# Patient Record
Sex: Female | Born: 1945 | Race: White | Hispanic: No | Marital: Married | Smoking: Never smoker
Health system: Southern US, Community
[De-identification: ages and names within clinical notes are randomized; demographics above are authoritative.]

## PROBLEM LIST (undated history)

## (undated) DIAGNOSIS — E039 Hypothyroidism, unspecified: Secondary | ICD-10-CM

## (undated) DIAGNOSIS — K5792 Diverticulitis of intestine, part unspecified, without perforation or abscess without bleeding: Secondary | ICD-10-CM

## (undated) HISTORY — DX: Diverticulitis of intestine, part unspecified, without perforation or abscess without bleeding: K57.92

## (undated) HISTORY — PX: APPENDECTOMY: SHX54

## (undated) HISTORY — PX: DILATION AND CURETTAGE OF UTERUS: SHX78

## (undated) HISTORY — PX: COLONOSCOPY: SHX174

---

## 1998-08-11 ENCOUNTER — Other Ambulatory Visit: Admission: RE | Admit: 1998-08-11 | Discharge: 1998-08-11 | Payer: Self-pay | Admitting: Gynecology

## 1999-11-14 ENCOUNTER — Other Ambulatory Visit: Admission: RE | Admit: 1999-11-14 | Discharge: 1999-11-14 | Payer: Self-pay | Admitting: Gynecology

## 2001-02-14 ENCOUNTER — Other Ambulatory Visit: Admission: RE | Admit: 2001-02-14 | Discharge: 2001-02-14 | Payer: Self-pay | Admitting: Gynecology

## 2003-04-06 ENCOUNTER — Other Ambulatory Visit: Admission: RE | Admit: 2003-04-06 | Discharge: 2003-04-06 | Payer: Self-pay | Admitting: Gynecology

## 2005-02-09 ENCOUNTER — Other Ambulatory Visit: Admission: RE | Admit: 2005-02-09 | Discharge: 2005-02-09 | Payer: Self-pay | Admitting: Gynecology

## 2005-03-01 ENCOUNTER — Ambulatory Visit: Payer: Self-pay | Admitting: Internal Medicine

## 2005-03-01 ENCOUNTER — Ambulatory Visit (HOSPITAL_COMMUNITY): Admission: RE | Admit: 2005-03-01 | Discharge: 2005-03-01 | Payer: Self-pay | Admitting: Internal Medicine

## 2013-11-25 ENCOUNTER — Encounter (INDEPENDENT_AMBULATORY_CARE_PROVIDER_SITE_OTHER): Payer: Self-pay | Admitting: *Deleted

## 2013-12-15 ENCOUNTER — Ambulatory Visit (INDEPENDENT_AMBULATORY_CARE_PROVIDER_SITE_OTHER): Payer: Medicare Other | Admitting: Internal Medicine

## 2013-12-15 ENCOUNTER — Encounter (INDEPENDENT_AMBULATORY_CARE_PROVIDER_SITE_OTHER): Payer: Self-pay | Admitting: Internal Medicine

## 2013-12-15 VITALS — BP 108/58 | HR 60 | Temp 97.8°F | Ht 67.0 in | Wt 160.6 lb

## 2013-12-15 DIAGNOSIS — K5792 Diverticulitis of intestine, part unspecified, without perforation or abscess without bleeding: Secondary | ICD-10-CM | POA: Insufficient documentation

## 2013-12-15 DIAGNOSIS — K5732 Diverticulitis of large intestine without perforation or abscess without bleeding: Secondary | ICD-10-CM

## 2013-12-15 DIAGNOSIS — E039 Hypothyroidism, unspecified: Secondary | ICD-10-CM | POA: Insufficient documentation

## 2013-12-15 NOTE — Progress Notes (Addendum)
Subjective:     Patient ID: Desiree Flores, female   DOB: Nov 25, 1945, 68 y.o.   MRN: [id number]  HPI Referred to our office by Dr. Gar Ponto for change in stool patterns.  Recently at [facility] with diagnosis of diverticulitis 11/10/2013. In hospital x 3 days.  After discharge she was on 2 antibiotics (DId not bring bottles). Appetite is good. No weight loss.  No abdominal pain. Usually has a BM at least twice a day. No melena or BRRB . She tells me before she had the diverticulitis, her BMs were not right. Her stools were narrow and pencil like. Symptoms for a couple of years. Since having the diverticulitis, her stools are back to normal.  Her stools were guaiac negative in Dr Arcola Jansky office.    Thinks paternal grandfather died of colon cancer ? Age.    Colonoscopy./EGD 2006 by Dr. Laural Golden: negative per Dr. Arcola Jansky notes.   7/6/015 ( Copy from [facility]) CT abdomen/pelvis while in hospital: Fatty infiltration of the liver, as well as inflammatory process in the sigmoid colon consistent with acute diverticulitis. Recommend follow up.  Review of Systems Past Medical History  Diagnosis Date  . Diverticulitis     Past Surgical History  Procedure Laterality Date  . Dilation and curettage of uterus      miscarriage  . Appendectomy      6-7 yrs ago    Allergies  Allergen Reactions  . Sulfa Antibiotics     No current outpatient prescriptions on file prior to visit.   No current facility-administered medications on file prior to visit.        Objective:   Physical Exam Filed Vitals:   12/15/13 1131  BP: 108/58  Pulse: 60  Temp: 97.8 F (36.6 C)  Height: 5\' 7"  (1.702 m)  Weight: 160 lb 9.6 oz (72.848 kg)   Alert and oriented. Skin warm and dry. Oral mucosa is moist.   . Sclera anicteric, conjunctivae is pink. Thyroid not enlarged. No cervical lymphadenopathy. Lungs clear. Heart regular rate and rhythm.  Abdomen is soft. Bowel sounds are positive. No hepatomegaly. No  abdominal masses felt. No tenderness.  No edema to lower extremities.       Assessment:    Recent bout of diverticulitis. Colonic neoplasm needs to be ruled out. Last colonoscopy in 2006;     Plan:    Colonoscopy with Dr. Laural Golden.The risks and benefits such as perforation, bleeding, and infection were reviewed with the patient and is agreeable.   Will get CT and colonoscopy report from [facility].

## 2013-12-15 NOTE — Patient Instructions (Signed)
Colonoscopy with Dr. Rehman. The risks and benefits such as perforation, bleeding, and infection were reviewed with the patient and is agreeable. 

## 2013-12-16 ENCOUNTER — Other Ambulatory Visit (INDEPENDENT_AMBULATORY_CARE_PROVIDER_SITE_OTHER): Payer: Self-pay | Admitting: *Deleted

## 2013-12-16 ENCOUNTER — Telehealth (INDEPENDENT_AMBULATORY_CARE_PROVIDER_SITE_OTHER): Payer: Self-pay | Admitting: *Deleted

## 2013-12-16 DIAGNOSIS — Z1211 Encounter for screening for malignant neoplasm of colon: Secondary | ICD-10-CM

## 2013-12-16 DIAGNOSIS — K5712 Diverticulitis of small intestine without perforation or abscess without bleeding: Secondary | ICD-10-CM

## 2013-12-16 NOTE — Telephone Encounter (Signed)
Patient needs movi prep 

## 2013-12-17 MED ORDER — PEG-KCL-NACL-NASULF-NA ASC-C 100 G PO SOLR: 1.0000 | Freq: Once | ORAL | Status: DC

## 2014-01-02 ENCOUNTER — Encounter (INDEPENDENT_AMBULATORY_CARE_PROVIDER_SITE_OTHER): Payer: Self-pay | Admitting: *Deleted

## 2014-01-27 ENCOUNTER — Encounter (HOSPITAL_COMMUNITY): Payer: Self-pay | Admitting: Pharmacy Technician

## 2014-01-28 ENCOUNTER — Ambulatory Visit (HOSPITAL_COMMUNITY)
Admission: RE | Admit: 2014-01-28 | Discharge: 2014-01-28 | Disposition: A | Discharge status: Home / Self Care | Payer: Medicare Other | Source: Ambulatory Visit | Attending: Internal Medicine | Admitting: Internal Medicine

## 2014-01-28 ENCOUNTER — Encounter (HOSPITAL_COMMUNITY): Payer: Self-pay | Admitting: *Deleted

## 2014-01-28 ENCOUNTER — Encounter (HOSPITAL_COMMUNITY)
Admission: RE | Disposition: A | Discharge status: Home / Self Care | Payer: Self-pay | Source: Ambulatory Visit | Attending: Internal Medicine

## 2014-01-28 DIAGNOSIS — Z09 Encounter for follow-up examination after completed treatment for conditions other than malignant neoplasm: Secondary | ICD-10-CM | POA: Diagnosis not present

## 2014-01-28 DIAGNOSIS — E039 Hypothyroidism, unspecified: Secondary | ICD-10-CM | POA: Diagnosis not present

## 2014-01-28 DIAGNOSIS — K5730 Diverticulosis of large intestine without perforation or abscess without bleeding: Secondary | ICD-10-CM | POA: Insufficient documentation

## 2014-01-28 DIAGNOSIS — K5712 Diverticulitis of small intestine without perforation or abscess without bleeding: Secondary | ICD-10-CM

## 2014-01-28 DIAGNOSIS — Z79899 Other long term (current) drug therapy: Secondary | ICD-10-CM | POA: Diagnosis not present

## 2014-01-28 DIAGNOSIS — K5732 Diverticulitis of large intestine without perforation or abscess without bleeding: Secondary | ICD-10-CM | POA: Diagnosis present

## 2014-01-28 HISTORY — DX: Hypothyroidism, unspecified: E03.9

## 2014-01-28 HISTORY — PX: COLONOSCOPY: SHX5424

## 2014-01-28 SURGERY — COLONOSCOPY
Anesthesia: Moderate Sedation

## 2014-01-28 MED ORDER — MEPERIDINE HCL 50 MG/ML IJ SOLN
INTRAMUSCULAR | Status: DC | PRN
  Administered 2014-01-28 (×2): 25 mg via INTRAVENOUS

## 2014-01-28 MED ORDER — STERILE WATER FOR IRRIGATION IR SOLN
Status: DC | PRN
  Administered 2014-01-28: 14:00:00

## 2014-01-28 MED ORDER — MIDAZOLAM HCL 5 MG/5ML IJ SOLN
INTRAMUSCULAR | Status: AC
  Filled 2014-01-28: qty 10

## 2014-01-28 MED ORDER — MEPERIDINE HCL 50 MG/ML IJ SOLN
INTRAMUSCULAR | Status: AC
  Filled 2014-01-28: qty 1

## 2014-01-28 MED ORDER — SODIUM CHLORIDE 0.9 % IV SOLN
INTRAVENOUS | Status: DC
  Administered 2014-01-28: 13:00:00 via INTRAVENOUS

## 2014-01-28 MED ORDER — MIDAZOLAM HCL 5 MG/5ML IJ SOLN
INTRAMUSCULAR | Status: DC | PRN
  Administered 2014-01-28 (×4): 2 mg via INTRAVENOUS

## 2014-01-28 NOTE — H&P (Signed)
Desiree Flores is an 68 y.o. female.   Chief Complaint: Patient is here for colonoscopy. HPI: This is a 68 year old Caucasian female whose last colonoscopy was in 9 years ago was treated for diverticulitis in July of this year. He was hospitalized by Dr. Gar Ponto at [facility] for three days. She is presently asymptomatic. She denies rectal bleeding. She did notice decrease in caliber of her stools when she was sick. She believes her grandfather had colon cancer but she is not absolutely certain.  Past Medical History  Diagnosis Date  . Diverticulitis   . Hypothyroidism     Past Surgical History  Procedure Laterality Date  . Dilation and curettage of uterus      miscarriage  . Appendectomy      6-7 yrs ago  . Colonoscopy      Family History  Problem Relation Age of Onset  . Colon cancer Other    Social History:  reports that she has never smoked. She does not have any smokeless tobacco history on file. She reports that she does not drink alcohol or use illicit drugs.  Allergies:  Allergies  Allergen Reactions  . Sulfa Antibiotics     Medications Prior to Admission  Medication Sig Dispense Refill  . glucosamine-chondroitin 500-400 MG tablet Take 2 tablets by mouth daily.       Marland Kitchen ibuprofen (ADVIL,MOTRIN) 200 MG tablet Take 200 mg by mouth every 6 (six) hours as needed.      Marland Kitchen levothyroxine (SYNTHROID, LEVOTHROID) 100 MCG tablet Take 100 mcg by mouth daily before breakfast.      . Multiple Vitamins-Minerals ([employer] SPECTRAVITE ADVANCED PO) Take by mouth.      Marland Kitchen OVER THE COUNTER MEDICATION Take 1 tablet by mouth daily.      . peg 3350 powder (MOVIPREP) 100 G SOLR Take 1 kit (200 g total) by mouth once.  1 kit  0    No results found for this or any previous visit (from the past 48 hour(s)). No results found.  ROS  Blood pressure 147/83, pulse 86, temperature 98.1 F (36.7 C), temperature source Oral, resp. rate 18, height 5' 7"  (1.702 m), weight 144 lb (65.318 kg), SpO2  98.00%. Physical Exam  Constitutional: She appears well-developed and well-nourished.  HENT:  Mouth/Throat: Oropharynx is clear and moist.  Eyes: Conjunctivae are normal. No scleral icterus.  Neck: No thyromegaly present.  Cardiovascular: Normal rate, regular rhythm and normal heart sounds.   No murmur heard. Respiratory: Effort normal and breath sounds normal.  GI: Soft. She exhibits no distension and no mass. There is no tenderness.  Musculoskeletal: She exhibits no edema.  Lymphadenopathy:    She has no cervical adenopathy.  Neurological: She is alert.  Skin: Skin is warm and dry.     Assessment/Plan History of sigmoid diverticulitis. Diagnostic colonoscopy.  Desiree Flores U 01/28/2014, 1:52 PM

## 2014-01-28 NOTE — Discharge Instructions (Addendum)
Resume usual medications and high fiber diet. No driving for 24 hours.  Colonoscopy, Care After Refer to this sheet in the next few weeks. These instructions provide you with information on caring for yourself after your procedure. Your health care provider may also give you more specific instructions. Your treatment has been planned according to current medical practices, but problems sometimes occur. Call your health care provider if you have any problems or questions after your procedure. WHAT TO EXPECT AFTER THE PROCEDURE  After your procedure, it is typical to have the following:  A small amount of blood in your stool.  Moderate amounts of gas and mild abdominal cramping or bloating. HOME CARE INSTRUCTIONS  Do not drive, operate machinery, or sign important documents for 24 hours.  You may shower and resume your regular physical activities, but move at a slower pace for the first 24 hours.  Take frequent rest periods for the first 24 hours.  Walk around or put a warm pack on your abdomen to help reduce abdominal cramping and bloating.  Drink enough fluids to keep your urine clear or pale yellow.  You may resume your normal diet as instructed by your health care provider. Avoid heavy or fried foods that are hard to digest.  Avoid drinking alcohol for 24 hours or as instructed by your health care provider.  Only take over-the-counter or prescription medicines as directed by your health care provider.Marland Kitchen SEEK MEDICAL CARE IF: You have persistent spotting of blood in your stool 2-3 days after the procedure. SEEK IMMEDIATE MEDICAL CARE IF:  You have more than a small spotting of blood in your stool.  You pass large blood clots in your stool.  Your abdomen is swollen (distended).  You have nausea or vomiting.  You have a fever.  You have increasing abdominal pain that is not relieved with medicine.   Diverticulosis Diverticulosis is the condition that develops when small  pouches (diverticula) form in the wall of your colon. Your colon, or large intestine, is where water is absorbed and stool is formed. The pouches form when the inside layer of your colon pushes through weak spots in the outer layers of your colon. CAUSES  No one knows exactly what causes diverticulosis. RISK FACTORS  Being older than 5. Your risk for this condition increases with age. Diverticulosis is rare in people younger than 40 years. By age 51, almost everyone has it.  Eating a low-fiber diet.  Being frequently constipated.  Being overweight.  Not getting enough exercise.  Smoking.  Taking over-the-counter pain medicines, like aspirin and ibuprofen. SYMPTOMS  Most people with diverticulosis do not have symptoms. DIAGNOSIS  Because diverticulosis often has no symptoms, health care providers often discover the condition during an exam for other colon problems. In many cases, a health care provider will diagnose diverticulosis while using a flexible scope to examine the colon (colonoscopy). TREATMENT  If you have never developed an infection related to diverticulosis, you may not need treatment. If you have had an infection before, treatment may include:  Eating more fruits, vegetables, and grains.  Taking a fiber supplement.  Taking a live bacteria supplement (probiotic).  Taking medicine to relax your colon. HOME CARE INSTRUCTIONS   Drink at least 6-8 glasses of water each day to prevent constipation.  Try not to strain when you have a bowel movement.  Keep all follow-up appointments. If you have had an infection before:  Increase the fiber in your diet as directed by your health  care provider or dietitian.  Take a dietary fiber supplement if your health care provider approves.  Only take medicines as directed by your health care provider. SEEK MEDICAL CARE IF:   You have abdominal pain.  You have bloating.  You have cramps.  You have not gone to the  bathroom in 3 days. SEEK IMMEDIATE MEDICAL CARE IF:   Your pain gets worse.  Yourbloating becomes very bad.  You have a fever or chills, and your symptoms suddenly get worse.  You begin vomiting.  You have bowel movements that are bloody or black. MAKE SURE YOU:  Understand these instructions.  Will watch your condition.  Will get help right away if you are not doing well or get worse. Document Released: 01/20/2004 Document Revised: 04/29/2013 Document Reviewed: 03/19/2013 [facility] Patient Information 2015 [place], [place]. This information is not intended to replace advice given to you by your health care provider. Make sure you discuss any questions you have with your health care provider.

## 2014-01-28 NOTE — Op Note (Signed)
COLONOSCOPY PROCEDURE REPORT  PATIENT:  Desiree Flores  MR#:  [id number] Birthdate:  21-Nov-1945, 68 y.o., female Endoscopist:  Dr. Rogene Houston, MD Referred By:  Dr. Gar Ponto, MD  Procedure Date: 01/28/2014  Procedure:   Colonoscopy  Indications: Patient is 68 year old Caucasian female who is here for diagnostic colonoscopy. About two months ago she was treated for sigmoid diverticulitis. She is now symptom free. Last colonoscopy was in 2006. Family history is negative for CRC to  Informed Consent:  The procedure and risks were reviewed with the patient and informed consent was obtained.  Medications:  Demerol 50 mg IV Versed 8 mg IV  Description of procedure:  After a digital rectal exam was performed, that colonoscope was advanced from the anus through the rectum and colon to the area of the cecum, ileocecal valve and appendiceal orifice. The cecum was deeply intubated. These structures were well-seen and photographed for the record. From the level of the cecum and ileocecal valve, the scope was slowly and cautiously withdrawn. The mucosal surfaces were carefully surveyed utilizing scope tip to flexion to facilitate fold flattening as needed. The scope was pulled down into the rectum where a thorough exam including retroflexion was performed. Procedure was started with pediatric colonoscope but was changed to a slim scope in order to pass flow from sigmoid into the descending colon.  Findings:   Prep excellent. Normal mucosa of cecum, ascending colon hepatic flexure transverse colon, splenic flexure and descending colon. Scattered small diverticula noted in mid to proximal sigmoid colon without erythema edema or exudate. Normal rectal mucosa and anorectal junction.   Therapeutic/Diagnostic Maneuvers Performed:   None  Complications:  None  Cecal Withdrawal Time:  11 minutes  Impression:  Examination performed to cecum. Sigmoid colon diverticulosis otherwise normal  colonoscopy.  Recommendations:  Standard instructions given. High fiber diet. Patient advised to keep NSAID use to minimum. Next colonoscopy in 10 years.  Hadasah Brugger U  01/28/2014 2:40 PM  CC: Dr. Gar Ponto, MD & Dr. Rayne Du ref. provider found

## 2014-01-30 ENCOUNTER — Encounter (HOSPITAL_COMMUNITY): Payer: Self-pay | Admitting: Internal Medicine

## 2014-04-24 ENCOUNTER — Encounter (INDEPENDENT_AMBULATORY_CARE_PROVIDER_SITE_OTHER): Payer: Self-pay

## 2014-08-10 DIAGNOSIS — K219 Gastro-esophageal reflux disease without esophagitis: Secondary | ICD-10-CM | POA: Diagnosis not present

## 2014-08-10 DIAGNOSIS — E782 Mixed hyperlipidemia: Secondary | ICD-10-CM | POA: Diagnosis not present

## 2014-08-10 DIAGNOSIS — K760 Fatty (change of) liver, not elsewhere classified: Secondary | ICD-10-CM | POA: Diagnosis not present

## 2014-08-10 DIAGNOSIS — E039 Hypothyroidism, unspecified: Secondary | ICD-10-CM | POA: Diagnosis not present

## 2014-08-13 DIAGNOSIS — E782 Mixed hyperlipidemia: Secondary | ICD-10-CM | POA: Diagnosis not present

## 2014-08-13 DIAGNOSIS — K219 Gastro-esophageal reflux disease without esophagitis: Secondary | ICD-10-CM | POA: Diagnosis not present

## 2014-08-13 DIAGNOSIS — K760 Fatty (change of) liver, not elsewhere classified: Secondary | ICD-10-CM | POA: Diagnosis not present

## 2014-08-13 DIAGNOSIS — K5730 Diverticulosis of large intestine without perforation or abscess without bleeding: Secondary | ICD-10-CM | POA: Diagnosis not present

## 2014-08-13 DIAGNOSIS — E039 Hypothyroidism, unspecified: Secondary | ICD-10-CM | POA: Diagnosis not present

## 2015-02-18 DIAGNOSIS — Z23 Encounter for immunization: Secondary | ICD-10-CM | POA: Diagnosis not present

## 2015-04-26 DIAGNOSIS — M545 Low back pain: Secondary | ICD-10-CM | POA: Diagnosis not present

## 2015-07-20 DIAGNOSIS — E039 Hypothyroidism, unspecified: Secondary | ICD-10-CM | POA: Diagnosis not present

## 2015-07-20 DIAGNOSIS — M1711 Unilateral primary osteoarthritis, right knee: Secondary | ICD-10-CM | POA: Diagnosis not present

## 2015-07-20 DIAGNOSIS — S6981XA Other specified injuries of right wrist, hand and finger(s), initial encounter: Secondary | ICD-10-CM | POA: Diagnosis not present

## 2015-07-20 DIAGNOSIS — E782 Mixed hyperlipidemia: Secondary | ICD-10-CM | POA: Diagnosis not present

## 2015-07-20 DIAGNOSIS — K219 Gastro-esophageal reflux disease without esophagitis: Secondary | ICD-10-CM | POA: Diagnosis not present

## 2015-07-23 DIAGNOSIS — E039 Hypothyroidism, unspecified: Secondary | ICD-10-CM | POA: Diagnosis not present

## 2015-07-23 DIAGNOSIS — K5730 Diverticulosis of large intestine without perforation or abscess without bleeding: Secondary | ICD-10-CM | POA: Diagnosis not present

## 2015-07-23 DIAGNOSIS — Z23 Encounter for immunization: Secondary | ICD-10-CM | POA: Diagnosis not present

## 2015-07-23 DIAGNOSIS — E782 Mixed hyperlipidemia: Secondary | ICD-10-CM | POA: Diagnosis not present

## 2015-07-23 DIAGNOSIS — M20011 Mallet finger of right finger(s): Secondary | ICD-10-CM | POA: Diagnosis not present

## 2015-07-23 DIAGNOSIS — Z0001 Encounter for general adult medical examination with abnormal findings: Secondary | ICD-10-CM | POA: Diagnosis not present

## 2015-07-23 DIAGNOSIS — Z1389 Encounter for screening for other disorder: Secondary | ICD-10-CM | POA: Diagnosis not present

## 2015-07-30 ENCOUNTER — Encounter (HOSPITAL_COMMUNITY): Payer: Self-pay | Admitting: Emergency Medicine

## 2015-07-30 ENCOUNTER — Emergency Department (HOSPITAL_COMMUNITY): Payer: Medicare Other

## 2015-07-30 ENCOUNTER — Emergency Department (HOSPITAL_COMMUNITY)
Admission: EM | Admit: 2015-07-30 | Discharge: 2015-07-30 | Disposition: A | Discharge status: Home / Self Care | Payer: Medicare Other | Source: Home / Self Care | Attending: Emergency Medicine | Admitting: Emergency Medicine

## 2015-07-30 DIAGNOSIS — E039 Hypothyroidism, unspecified: Secondary | ICD-10-CM | POA: Insufficient documentation

## 2015-07-30 DIAGNOSIS — K449 Diaphragmatic hernia without obstruction or gangrene: Secondary | ICD-10-CM | POA: Diagnosis not present

## 2015-07-30 DIAGNOSIS — R109 Unspecified abdominal pain: Secondary | ICD-10-CM | POA: Diagnosis not present

## 2015-07-30 DIAGNOSIS — R112 Nausea with vomiting, unspecified: Secondary | ICD-10-CM | POA: Insufficient documentation

## 2015-07-30 DIAGNOSIS — K219 Gastro-esophageal reflux disease without esophagitis: Secondary | ICD-10-CM | POA: Diagnosis not present

## 2015-07-30 DIAGNOSIS — Z79899 Other long term (current) drug therapy: Secondary | ICD-10-CM

## 2015-07-30 DIAGNOSIS — R1013 Epigastric pain: Secondary | ICD-10-CM | POA: Diagnosis not present

## 2015-07-30 DIAGNOSIS — K859 Acute pancreatitis without necrosis or infection, unspecified: Secondary | ICD-10-CM | POA: Diagnosis not present

## 2015-07-30 DIAGNOSIS — Z823 Family history of stroke: Secondary | ICD-10-CM | POA: Diagnosis not present

## 2015-07-30 DIAGNOSIS — Z800 Family history of malignant neoplasm of digestive organs: Secondary | ICD-10-CM | POA: Diagnosis not present

## 2015-07-30 DIAGNOSIS — R0789 Other chest pain: Secondary | ICD-10-CM

## 2015-07-30 DIAGNOSIS — K5712 Diverticulitis of small intestine without perforation or abscess without bleeding: Secondary | ICD-10-CM | POA: Diagnosis not present

## 2015-07-30 DIAGNOSIS — K222 Esophageal obstruction: Secondary | ICD-10-CM | POA: Diagnosis not present

## 2015-07-30 DIAGNOSIS — K5710 Diverticulosis of small intestine without perforation or abscess without bleeding: Secondary | ICD-10-CM | POA: Diagnosis not present

## 2015-07-30 DIAGNOSIS — Z791 Long term (current) use of non-steroidal anti-inflammatories (NSAID): Secondary | ICD-10-CM | POA: Insufficient documentation

## 2015-07-30 DIAGNOSIS — M1711 Unilateral primary osteoarthritis, right knee: Secondary | ICD-10-CM | POA: Diagnosis not present

## 2015-07-30 DIAGNOSIS — Z8249 Family history of ischemic heart disease and other diseases of the circulatory system: Secondary | ICD-10-CM | POA: Diagnosis not present

## 2015-07-30 DIAGNOSIS — R079 Chest pain, unspecified: Secondary | ICD-10-CM

## 2015-07-30 DIAGNOSIS — K2980 Duodenitis without bleeding: Secondary | ICD-10-CM | POA: Diagnosis not present

## 2015-07-30 LAB — CBC WITH DIFFERENTIAL/PLATELET
BASOS ABS: 0 10*3/uL (ref 0.0–0.1)
BASOS PCT: 0 %
Eosinophils Absolute: 0.2 10*3/uL (ref 0.0–0.7)
Eosinophils Relative: 2 %
HCT: 44.7 % (ref 36.0–46.0)
HEMOGLOBIN: 15 g/dL (ref 12.0–15.0)
Lymphocytes Relative: 16 %
Lymphs Abs: 1.5 10*3/uL (ref 0.7–4.0)
MCH: 29.8 pg (ref 26.0–34.0)
MCHC: 33.6 g/dL (ref 30.0–36.0)
MCV: 88.7 fL (ref 78.0–100.0)
Monocytes Absolute: 0.7 10*3/uL (ref 0.1–1.0)
Monocytes Relative: 8 %
NEUTROS ABS: 7 10*3/uL (ref 1.7–7.7)
NEUTROS PCT: 74 %
PLATELETS: 162 10*3/uL (ref 150–400)
RBC: 5.04 MIL/uL (ref 3.87–5.11)
RDW: 13.3 % (ref 11.5–15.5)
WBC: 9.5 10*3/uL (ref 4.0–10.5)

## 2015-07-30 LAB — BASIC METABOLIC PANEL
ANION GAP: 10 (ref 5–15)
BUN: 22 mg/dL — ABNORMAL HIGH (ref 6–20)
CALCIUM: 10.3 mg/dL (ref 8.9–10.3)
CO2: 27 mmol/L (ref 22–32)
Chloride: 104 mmol/L (ref 101–111)
Creatinine, Ser: 0.91 mg/dL (ref 0.44–1.00)
GFR calc non Af Amer: >60 mL/min (ref >60)
GLUCOSE: 118 mg/dL — AB (ref 65–99)
Potassium: 3.7 mmol/L (ref 3.5–5.1)
Sodium: 141 mmol/L (ref 135–145)

## 2015-07-30 LAB — HEPATIC FUNCTION PANEL
ALT: 53 U/L (ref 14–54)
AST: 32 U/L (ref 15–41)
Albumin: 4.3 g/dL (ref 3.5–5.0)
Alkaline Phosphatase: 68 U/L (ref 38–126)
BILIRUBIN INDIRECT: 0.6 mg/dL (ref 0.3–0.9)
BILIRUBIN TOTAL: 0.7 mg/dL (ref 0.3–1.2)
Bilirubin, Direct: 0.1 mg/dL (ref 0.1–0.5)
TOTAL PROTEIN: 7.2 g/dL (ref 6.5–8.1)

## 2015-07-30 LAB — LIPASE, BLOOD: Lipase: 49 U/L (ref 11–51)

## 2015-07-30 LAB — TROPONIN I

## 2015-07-30 MED ORDER — ONDANSETRON 8 MG PO TBDP: 8.0000 mg | ORAL_TABLET | Freq: Three times a day (TID) | ORAL | Status: DC | PRN

## 2015-07-30 MED ORDER — GI COCKTAIL ~~LOC~~
30.0000 mL | Freq: Once | ORAL | Status: AC
  Administered 2015-07-30: 30 mL via ORAL
  Filled 2015-07-30: qty 30

## 2015-07-30 MED ORDER — ONDANSETRON 8 MG PO TBDP
8.0000 mg | ORAL_TABLET | Freq: Once | ORAL | Status: AC
  Administered 2015-07-30: 8 mg via ORAL
  Filled 2015-07-30: qty 1

## 2015-07-30 MED ORDER — MORPHINE SULFATE (PF) 4 MG/ML IV SOLN
4.0000 mg | Freq: Once | INTRAVENOUS | Status: AC
  Administered 2015-07-30: 4 mg via INTRAVENOUS
  Filled 2015-07-30: qty 1

## 2015-07-30 MED ORDER — OMEPRAZOLE 20 MG PO CPDR: 20.0000 mg | DELAYED_RELEASE_CAPSULE | Freq: Two times a day (BID) | ORAL | Status: DC

## 2015-07-30 NOTE — ED Provider Notes (Signed)
Patient developed nausea and vomited twice in the emergency department.  Repeat abdominal exam is benign.  Patient was observed in the emergency department with no more vomiting.  She was given Zofran and feels better at this time.  Vital signs remained stable.  Discharge home in good condition.  Primary care follow-up.  No indication for imaging of her abdomen.  Doubt ACS.  Doubt intra-abdominal pathology.  Jola Schmidt, MD 07/30/15 [phone]

## 2015-07-30 NOTE — ED Notes (Signed)
MD at bedside. 

## 2015-07-30 NOTE — Discharge Instructions (Signed)

## 2015-07-30 NOTE — ED Provider Notes (Signed)
CSN: [id number]     Arrival date & time 07/30/15  [id number] History  By signing my name below, I, Desiree Flores, attest that this documentation has been prepared under the direction and in the presence of Jola Schmidt, MD. Electronically Signed: Terressa Flores, ED Scribe. 07/30/2015. 9:49 AM.  No chief complaint on file.  The history is provided by the patient. No language interpreter was used.   PCP: Gar Ponto, MD HPI Comments: Desiree Flores is a 70 y.o. female, with PMHx noted below, who presents to the Emergency Department complaining of constant, atraumatic, non-radiating, burning, worsening lower chest pain onset last night at 10pm when pt first got into bed to go to sleep. Pt repots taking 4 baby aspirin, tums 2x at home without relief. Pt denies any Hx of DM, tobacco use, hx of blood clots. Pt further denies recent cough, SOB, or any other Sx at this time.   Past Medical History  Diagnosis Date  . Diverticulitis   . Hypothyroidism    Past Surgical History  Procedure Laterality Date  . Dilation and curettage of uterus      miscarriage  . Appendectomy      6-7 yrs ago  . Colonoscopy    . Colonoscopy N/A 01/28/2014    Procedure: COLONOSCOPY;  Surgeon: Rogene Houston, MD;  Location: [facility];  Service: Endoscopy;  Laterality: N/A;  200   Family History  Problem Relation Age of Onset  . Colon cancer Other    Social History  Substance Use Topics  . Smoking status: Never Smoker   . Smokeless tobacco: None  . Alcohol Use: No   OB History    No data available     Review of Systems A complete 10 system review of systems was obtained and all systems are negative except as noted in the HPI and PMH.   Allergies  Sulfa antibiotics  Home Medications   Prior to Admission medications   Medication Sig Start Date End Date Taking? Authorizing Provider  glucosamine-chondroitin 500-400 MG tablet Take 2 tablets by mouth daily.    Yes Historical Provider, MD  ibuprofen  (ADVIL,MOTRIN) 200 MG tablet Take 200 mg by mouth every 6 (six) hours as needed.   Yes Historical Provider, MD  levothyroxine (SYNTHROID, LEVOTHROID) 100 MCG tablet Take 100 mcg by mouth daily before breakfast.   Yes Historical Provider, MD  Multiple Vitamins-Minerals ([employer]S SPECTRAVITE ADVANCED PO) Take by mouth.   Yes Historical Provider, MD  OVER THE COUNTER MEDICATION Take 1 tablet by mouth daily.   Yes Historical Provider, MD   Triage Vitals: BP 136/85 mmHg  Pulse 83  Temp(Src) 98.4 F (36.9 C) (Oral)  Resp 16  Ht 5\' 7"  (1.702 m)  Wt 150 lb (68.04 kg)  BMI 23.49 kg/m2  SpO2 96% Physical Exam  Constitutional: She is oriented to person, place, and time. She appears well-developed and well-nourished. No distress.  HENT:  Head: Normocephalic and atraumatic.  Eyes: EOM are normal.  Neck: Normal range of motion.  Cardiovascular: Normal rate, regular rhythm and normal heart sounds.   Pulmonary/Chest: Effort normal and breath sounds normal. No respiratory distress. She exhibits no tenderness.  Abdominal: Soft. She exhibits no distension. There is no tenderness (no epigastric tenderness).  Musculoskeletal: Normal range of motion.  Neurological: She is alert and oriented to person, place, and time.  Skin: Skin is warm and dry.  Psychiatric: She has a normal mood and affect. Judgment normal.  Nursing note and vitals reviewed.  ED Course  Procedures (including critical care time) DIAGNOSTIC STUDIES: Oxygen Saturation is 96% on ra, nl by my interpretation.    COORDINATION OF CARE: 9:48 AM: Discussed treatment plan which includes EKG results with pt at bedside; patient verbalizes understanding and agrees with treatment plan.  Labs Review Labs Reviewed  BASIC METABOLIC PANEL - Abnormal; Notable for the following:    Glucose, Bld 118 (*)    BUN 22 (*)    All other components within normal limits  CBC WITH DIFFERENTIAL/PLATELET  TROPONIN I  HEPATIC FUNCTION PANEL  LIPASE, BLOOD     Imaging Review Dg Chest 2 View  07/30/2015  CLINICAL DATA:  Chest pain for 1 day EXAM: CHEST  2 VIEW COMPARISON:  None. FINDINGS: Lungs are clear. The heart size and pulmonary vascularity are normal. No adenopathy. No pneumothorax. No bone lesions. IMPRESSION: No edema or consolidation. Electronically Signed   By: Lowella Grip III M.D.   On: 07/30/2015 09:33   I have personally reviewed and evaluated these images and lab results as part of my medical decision-making.   EKG Interpretation   Date/Time:  Friday July 30 2015 09:07:39 EDT Ventricular Rate:  82 PR Interval:  138 QRS Duration: 91 QT Interval:  372 QTC Calculation: 434 R Axis:   70 Text Interpretation:  Sinus rhythm Baseline wander in lead(s) V1 V2 No old  tracing to compare Confirmed by Tray Klayman  MD, Cleota Pellerito ([id number]) on 07/30/2015  9:29:52 AM      MDM   Final diagnoses:  None    Patient with constant chest discomfort since last night which is likely esophageal in origin.  Troponin negative and EKG without ischemic changes.  Doubt ACS.  Doubt PE.  Doubt dissection.  Doubt intra-abdominal pathology.  She's had intermittent symptoms in the past of some difficulty swallowing.  She likely will benefit from EGD.  She's been referred to gastroenterology.  I'll place her on twice a day Prilosec.  She understands to return to the emergency department for new or worsening symptoms.  No indication for additional workup at this time.  I personally performed the services described in this documentation, which was scribed in my presence. The recorded information has been reviewed and is accurate.       Jola Schmidt, MD 07/30/15 1048

## 2015-07-30 NOTE — ED Notes (Signed)
Pt vomiting. Dr. Venora Maples informed. Pt to be placed back in system.

## 2015-07-31 ENCOUNTER — Encounter (HOSPITAL_COMMUNITY): Payer: Self-pay

## 2015-07-31 ENCOUNTER — Inpatient Hospital Stay (HOSPITAL_COMMUNITY)
Admission: EM | Admit: 2015-07-31 | Discharge: 2015-08-04 | DRG: 391 | Disposition: A | Discharge status: Home / Self Care | Payer: Medicare Other | Attending: Internal Medicine | Admitting: Internal Medicine

## 2015-07-31 ENCOUNTER — Inpatient Hospital Stay (HOSPITAL_COMMUNITY): Payer: Medicare Other

## 2015-07-31 ENCOUNTER — Emergency Department (HOSPITAL_COMMUNITY): Payer: Medicare Other

## 2015-07-31 DIAGNOSIS — K222 Esophageal obstruction: Secondary | ICD-10-CM | POA: Diagnosis not present

## 2015-07-31 DIAGNOSIS — K2980 Duodenitis without bleeding: Secondary | ICD-10-CM | POA: Diagnosis not present

## 2015-07-31 DIAGNOSIS — K449 Diaphragmatic hernia without obstruction or gangrene: Secondary | ICD-10-CM | POA: Diagnosis not present

## 2015-07-31 DIAGNOSIS — K859 Acute pancreatitis without necrosis or infection, unspecified: Secondary | ICD-10-CM | POA: Diagnosis present

## 2015-07-31 DIAGNOSIS — Z8249 Family history of ischemic heart disease and other diseases of the circulatory system: Secondary | ICD-10-CM

## 2015-07-31 DIAGNOSIS — M1711 Unilateral primary osteoarthritis, right knee: Secondary | ICD-10-CM | POA: Diagnosis present

## 2015-07-31 DIAGNOSIS — K5712 Diverticulitis of small intestine without perforation or abscess without bleeding: Secondary | ICD-10-CM | POA: Diagnosis not present

## 2015-07-31 DIAGNOSIS — K2950 Unspecified chronic gastritis without bleeding: Secondary | ICD-10-CM | POA: Diagnosis not present

## 2015-07-31 DIAGNOSIS — Z823 Family history of stroke: Secondary | ICD-10-CM | POA: Diagnosis not present

## 2015-07-31 DIAGNOSIS — Z818 Family history of other mental and behavioral disorders: Secondary | ICD-10-CM

## 2015-07-31 DIAGNOSIS — R1013 Epigastric pain: Secondary | ICD-10-CM | POA: Diagnosis present

## 2015-07-31 DIAGNOSIS — K219 Gastro-esophageal reflux disease without esophagitis: Secondary | ICD-10-CM | POA: Diagnosis present

## 2015-07-31 DIAGNOSIS — R112 Nausea with vomiting, unspecified: Secondary | ICD-10-CM | POA: Diagnosis not present

## 2015-07-31 DIAGNOSIS — K5792 Diverticulitis of intestine, part unspecified, without perforation or abscess without bleeding: Secondary | ICD-10-CM | POA: Diagnosis present

## 2015-07-31 DIAGNOSIS — R1310 Dysphagia, unspecified: Secondary | ICD-10-CM | POA: Diagnosis not present

## 2015-07-31 DIAGNOSIS — Z800 Family history of malignant neoplasm of digestive organs: Secondary | ICD-10-CM | POA: Diagnosis not present

## 2015-07-31 DIAGNOSIS — K5710 Diverticulosis of small intestine without perforation or abscess without bleeding: Secondary | ICD-10-CM | POA: Diagnosis not present

## 2015-07-31 DIAGNOSIS — E039 Hypothyroidism, unspecified: Secondary | ICD-10-CM | POA: Diagnosis present

## 2015-07-31 DIAGNOSIS — R109 Unspecified abdominal pain: Secondary | ICD-10-CM | POA: Diagnosis not present

## 2015-07-31 LAB — COMPREHENSIVE METABOLIC PANEL
ALT: 124 U/L — AB (ref 14–54)
AST: 127 U/L — ABNORMAL HIGH (ref 15–41)
Albumin: 4.1 g/dL (ref 3.5–5.0)
Alkaline Phosphatase: 70 U/L (ref 38–126)
Anion gap: 8 (ref 5–15)
BUN: 18 mg/dL (ref 6–20)
CHLORIDE: 105 mmol/L (ref 101–111)
CO2: 26 mmol/L (ref 22–32)
CREATININE: 0.84 mg/dL (ref 0.44–1.00)
Calcium: 9.1 mg/dL (ref 8.9–10.3)
GFR calc non Af Amer: >60 mL/min (ref >60)
Glucose, Bld: 138 mg/dL — ABNORMAL HIGH (ref 65–99)
POTASSIUM: 3.7 mmol/L (ref 3.5–5.1)
SODIUM: 139 mmol/L (ref 135–145)
Total Bilirubin: 1.2 mg/dL (ref 0.3–1.2)
Total Protein: 7.7 g/dL (ref 6.5–8.1)

## 2015-07-31 LAB — CBC WITH DIFFERENTIAL/PLATELET
BASOS ABS: 0 10*3/uL (ref 0.0–0.1)
Basophils Relative: 0 %
EOS ABS: 0 10*3/uL (ref 0.0–0.7)
Eosinophils Relative: 0 %
HCT: 43 % (ref 36.0–46.0)
Hemoglobin: 14.8 g/dL (ref 12.0–15.0)
LYMPHS PCT: 7 %
Lymphs Abs: 0.9 10*3/uL (ref 0.7–4.0)
MCH: 30.5 pg (ref 26.0–34.0)
MCHC: 34.4 g/dL (ref 30.0–36.0)
MCV: 88.5 fL (ref 78.0–100.0)
Monocytes Absolute: 1 10*3/uL (ref 0.1–1.0)
Monocytes Relative: 8 %
Neutro Abs: 11.2 10*3/uL — ABNORMAL HIGH (ref 1.7–7.7)
Neutrophils Relative %: 85 %
PLATELETS: 150 10*3/uL (ref 150–400)
RBC: 4.86 MIL/uL (ref 3.87–5.11)
RDW: 13.4 % (ref 11.5–15.5)
WBC: 13.1 10*3/uL — ABNORMAL HIGH (ref 4.0–10.5)

## 2015-07-31 LAB — TROPONIN I: Troponin I: <0.03 ng/mL (ref <0.031)

## 2015-07-31 LAB — LIPASE, BLOOD: LIPASE: 85 U/L — AB (ref 11–51)

## 2015-07-31 MED ORDER — SODIUM CHLORIDE 0.9 % IV BOLUS (SEPSIS)
1000.0000 mL | Freq: Once | INTRAVENOUS | Status: AC
  Administered 2015-07-31: 1000 mL via INTRAVENOUS

## 2015-07-31 MED ORDER — DIPHENHYDRAMINE HCL 50 MG/ML IJ SOLN
25.0000 mg | Freq: Once | INTRAMUSCULAR | Status: AC
  Administered 2015-07-31: 25 mg via INTRAVENOUS
  Filled 2015-07-31: qty 1

## 2015-07-31 MED ORDER — ONDANSETRON HCL 4 MG PO TABS: 4.0000 mg | ORAL_TABLET | Freq: Four times a day (QID) | ORAL | Status: DC | PRN

## 2015-07-31 MED ORDER — PANTOPRAZOLE SODIUM 40 MG IV SOLR
40.0000 mg | Freq: Two times a day (BID) | INTRAVENOUS | Status: DC
  Administered 2015-07-31 – 2015-08-03 (×8): 40 mg via INTRAVENOUS
  Filled 2015-07-31 (×9): qty 40

## 2015-07-31 MED ORDER — CIPROFLOXACIN IN D5W 400 MG/200ML IV SOLN
400.0000 mg | Freq: Two times a day (BID) | INTRAVENOUS | Status: DC
  Administered 2015-07-31 – 2015-08-03 (×6): 400 mg via INTRAVENOUS
  Filled 2015-07-31 (×6): qty 200

## 2015-07-31 MED ORDER — FENTANYL CITRATE (PF) 100 MCG/2ML IJ SOLN
50.0000 ug | Freq: Once | INTRAMUSCULAR | Status: AC
  Administered 2015-07-31: 50 ug via INTRAVENOUS
  Filled 2015-07-31: qty 2

## 2015-07-31 MED ORDER — SODIUM CHLORIDE 0.9 % IV SOLN
INTRAVENOUS | Status: DC
  Administered 2015-07-31 – 2015-08-02 (×4): via INTRAVENOUS

## 2015-07-31 MED ORDER — ONDANSETRON HCL 4 MG/2ML IJ SOLN
4.0000 mg | Freq: Once | INTRAMUSCULAR | Status: AC
  Administered 2015-07-31: 4 mg via INTRAVENOUS
  Filled 2015-07-31: qty 2

## 2015-07-31 MED ORDER — ONDANSETRON HCL 4 MG/2ML IJ SOLN
4.0000 mg | Freq: Four times a day (QID) | INTRAMUSCULAR | Status: DC | PRN
Start: 2015-07-31 — End: 2015-08-04
  Administered 2015-07-31 – 2015-08-02 (×6): 4 mg via INTRAVENOUS
  Filled 2015-07-31 (×6): qty 2

## 2015-07-31 MED ORDER — FENTANYL CITRATE (PF) 100 MCG/2ML IJ SOLN: 50.0000 ug | INTRAMUSCULAR | Status: DC | PRN

## 2015-07-31 MED ORDER — METRONIDAZOLE IN NACL 5-0.79 MG/ML-% IV SOLN
500.0000 mg | Freq: Once | INTRAVENOUS | Status: AC
  Administered 2015-07-31: 500 mg via INTRAVENOUS
  Filled 2015-07-31: qty 100

## 2015-07-31 MED ORDER — SODIUM CHLORIDE 0.9 % IV SOLN
INTRAVENOUS | Status: DC
  Administered 2015-07-31: 10:00:00 via INTRAVENOUS

## 2015-07-31 MED ORDER — LEVOTHYROXINE SODIUM 100 MCG PO TABS
100.0000 ug | ORAL_TABLET | Freq: Every day | ORAL | Status: DC
  Administered 2015-08-01: 100 ug via ORAL
  Filled 2015-07-31 (×3): qty 1

## 2015-07-31 MED ORDER — MORPHINE SULFATE (PF) 2 MG/ML IV SOLN: 1.0000 mg | INTRAVENOUS | Status: DC | PRN

## 2015-07-31 MED ORDER — ENOXAPARIN SODIUM 40 MG/0.4ML ~~LOC~~ SOLN
40.0000 mg | SUBCUTANEOUS | Status: DC
  Administered 2015-07-31 – 2015-08-02 (×3): 40 mg via SUBCUTANEOUS
  Filled 2015-07-31 (×3): qty 0.4

## 2015-07-31 MED ORDER — METOCLOPRAMIDE HCL 5 MG/ML IJ SOLN
10.0000 mg | Freq: Once | INTRAMUSCULAR | Status: AC
  Administered 2015-07-31: 10 mg via INTRAVENOUS
  Filled 2015-07-31: qty 2

## 2015-07-31 MED ORDER — IOHEXOL 300 MG/ML  SOLN
75.0000 mL | Freq: Once | INTRAMUSCULAR | Status: AC | PRN
  Administered 2015-07-31: 75 mL via INTRAVENOUS

## 2015-07-31 MED ORDER — ACETAMINOPHEN 325 MG PO TABS: 650.0000 mg | ORAL_TABLET | Freq: Four times a day (QID) | ORAL | Status: DC | PRN

## 2015-07-31 MED ORDER — ACETAMINOPHEN 650 MG RE SUPP: 650.0000 mg | Freq: Four times a day (QID) | RECTAL | Status: DC | PRN

## 2015-07-31 MED ORDER — ONDANSETRON HCL 4 MG/2ML IJ SOLN: 4.0000 mg | Freq: Three times a day (TID) | INTRAMUSCULAR | Status: DC | PRN

## 2015-07-31 MED ORDER — CIPROFLOXACIN IN D5W 400 MG/200ML IV SOLN
400.0000 mg | Freq: Once | INTRAVENOUS | Status: AC
  Administered 2015-07-31: 400 mg via INTRAVENOUS
  Filled 2015-07-31: qty 200

## 2015-07-31 MED ORDER — METRONIDAZOLE IN NACL 5-0.79 MG/ML-% IV SOLN
500.0000 mg | Freq: Three times a day (TID) | INTRAVENOUS | Status: DC
  Administered 2015-07-31 – 2015-08-03 (×9): 500 mg via INTRAVENOUS
  Filled 2015-07-31 (×9): qty 100

## 2015-07-31 NOTE — ED Provider Notes (Signed)
CSN: [id number]     Arrival date & time 07/31/15  0221 History   First MD Initiated Contact with Patient 07/31/15 [phone]    Chief Complaint  Patient presents with  . Emesis     (Consider location/radiation/quality/duration/timing/severity/associated sxs/prior Treatment) HPI patient reports the morning of March 24 she had chest pain and indicates her central mid chest that she states it was a pressure feeling and "hard". She was not sure whether she was having a cardiac problem or possibly reflux problems. She came to the ED and was evaluated that morning. However just prior to discharge she did start having some vomiting and she was kept in the ED a little bit longer. She states she was discharged about 2 PM and states she had felt better. She went home and laid down until 5 PM. At that point she got up and she was going to try to eat however she stated that nothing appealed to her and she had some mild lingering nausea. She finally did eat a boiled egg however she did have an episode of vomiting just prior to coming to the ED this morning arriving about 3:30 in the morning. She denies diarrhea. She no longer has chest pain. She states she does have now have some upper abdominal discomfort that has been there since 5 PM. She states the discomfort is sharp. She states it lasted for 7 or 8 hours and it is now gone since she arrived to the ED. She states she's never had this happen before.   PCP Dr Dub Amis GI Dr Laural Golden  Has had colonoscopy x 2 (one screening, one after diverticulitis), no EDG  Past Medical History  Diagnosis Date  . Diverticulitis   . Hypothyroidism    Past Surgical History  Procedure Laterality Date  . Dilation and curettage of uterus      miscarriage  . Appendectomy      6-7 yrs ago  . Colonoscopy    . Colonoscopy N/A 01/28/2014    Procedure: COLONOSCOPY;  Surgeon: Rogene Houston, MD;  Location: [facility];  Service: Endoscopy;  Laterality: N/A;  200   Family History    Problem Relation Age of Onset  . Colon cancer Other    Social History  Substance Use Topics  . Smoking status: Never Smoker   . Smokeless tobacco: None  . 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     Allergies  Sulfa antibiotics  Home Medications   Prior to Admission medications   Medication Sig Start Date End Date Taking? Authorizing Provider  glucosamine-chondroitin 500-400 MG tablet Take 2 tablets by mouth daily.     Historical Provider, MD  ibuprofen (ADVIL,MOTRIN) 200 MG tablet Take 200 mg by mouth every 6 (six) hours as needed.    Historical Provider, MD  levothyroxine (SYNTHROID, LEVOTHROID) 100 MCG tablet Take 100 mcg by mouth daily before breakfast.    Historical Provider, MD  Multiple Vitamins-Minerals ([employer] SPECTRAVITE ADVANCED PO) Take by mouth.    Historical Provider, MD  omeprazole (PRILOSEC) 20 MG capsule Take 1 capsule (20 mg total) by mouth 2 (two) times daily before a meal. 07/30/15   Jola Schmidt, MD  ondansetron (ZOFRAN ODT) 8 MG disintegrating tablet Take 1 tablet (8 mg total) by mouth every 8 (eight) hours as needed for  nausea or vomiting. 07/30/15   Jola Schmidt, MD  OVER THE COUNTER MEDICATION Take 1 tablet by mouth daily.    Historical Provider, MD   BP 129/78 mmHg  Pulse 85  Resp 14  SpO2 93%  Vital signs normal   Physical Exam  Constitutional: She is oriented to person, place, and time. She appears well-developed and well-nourished.  Non-toxic appearance. She does not appear ill. No distress.  HENT:  Head: Normocephalic and atraumatic.  Right Ear: External ear normal.  Left Ear: External ear normal.  Nose: Nose normal. No mucosal edema or rhinorrhea.  Mouth/Throat: Oropharynx is clear and moist and mucous membranes are normal. No dental abscesses or uvula swelling.  Eyes: Conjunctivae and EOM are normal.  Pupils are equal, round, and reactive to light.  Neck: Normal range of motion and full passive range of motion without pain. Neck supple.  Cardiovascular: Normal rate, regular rhythm and normal heart sounds.  Exam reveals no gallop and no friction rub.   No murmur heard. Pulmonary/Chest: Effort normal and breath sounds normal. No respiratory distress. She has no wheezes. She has no rhonchi. She has no rales. She exhibits no tenderness and no crepitus.  Abdominal: Soft. Normal appearance and bowel sounds are normal. She exhibits no distension. There is tenderness. There is no rebound and no guarding.    Musculoskeletal: Normal range of motion. She exhibits no edema or tenderness.  Moves all extremities well.   Neurological: She is alert and oriented to person, place, and time. She has normal strength. No cranial nerve deficit.  Skin: Skin is warm, dry and intact. No rash noted. No erythema. No pallor.  Psychiatric: She has a normal mood and affect. Her speech is normal and behavior is normal. Her mood appears not anxious.  Nursing note and vitals reviewed.   ED Course  Procedures (including critical care time)  Medications  ciprofloxacin (CIPRO) IVPB 400 mg (400 mg Intravenous New Bag/Given 07/31/15 0719)  metroNIDAZOLE (FLAGYL) IVPB 500 mg (not administered)  ondansetron (ZOFRAN) injection 4 mg (4 mg Intravenous Given 07/31/15 0323)  ondansetron (ZOFRAN) injection 4 mg (4 mg Intravenous Given 07/31/15 0506)  fentaNYL (SUBLIMAZE) injection 50 mcg (50 mcg Intravenous Given 07/31/15 0506)  iohexol (OMNIPAQUE) 300 MG/ML solution 75 mL (75 mLs Intravenous Contrast Given 07/31/15 0603)  sodium chloride 0.9 % bolus 1,000 mL (1,000 mLs Intravenous New Bag/Given 07/31/15 0719)  metoCLOPramide (REGLAN) injection 10 mg (10 mg Intravenous Given 07/31/15 0717)  diphenhydrAMINE (BENADRYL) injection 25 mg (25 mg Intravenous Given 07/31/15 0717)   Patient was given IV pain and nausea medication. We discussed  getting a CT of her abdomen to look for pancreatitis or possibly cholecystitis. CT scan with contrast was ordered however patient had vomiting after she drank the contrast. CT scan was then done without redoing the contrast.  07:00 AM pt and husband given results of her CT scan. She was started on IV cipro and flagyl. She is agreeable for admission.   07:18 Dr Roderic Palau, admit to med-surg, we discussed getting an abdominal US to look for gallstones and he wants me to order it for this morning.    Labs Review Results for orders placed or performed during the hospital encounter of 07/31/15  Comprehensive metabolic panel  Result Value Ref Range   Sodium 139 135 - 145 mmol/L   Potassium 3.7 3.5 - 5.1 mmol/L   Chloride 105 101 - 111 mmol/L   CO2 26 22 - 32 mmol/L   Glucose, Bld 138 (  H) 65 - 99 mg/dL   BUN 18 6 - 20 mg/dL   Creatinine, Ser 0.84 0.44 - 1.00 mg/dL   Calcium 9.1 8.9 - 10.3 mg/dL   Total Protein 7.7 6.5 - 8.1 g/dL   Albumin 4.1 3.5 - 5.0 g/dL   AST 127 (H) 15 - 41 U/L   ALT 124 (H) 14 - 54 U/L   Alkaline Phosphatase 70 38 - 126 U/L   Total Bilirubin 1.2 0.3 - 1.2 mg/dL   GFR calc non Af Amer >60 >60 mL/min   GFR calc Af Amer >60 >60 mL/min   Anion gap 8 5 - 15  CBC with Differential  Result Value Ref Range   WBC 13.1 (H) 4.0 - 10.5 K/uL   RBC 4.86 3.87 - 5.11 MIL/uL   Hemoglobin 14.8 12.0 - 15.0 g/dL   HCT 43.0 36.0 - 46.0 %   MCV 88.5 78.0 - 100.0 fL   MCH 30.5 26.0 - 34.0 pg   MCHC 34.4 30.0 - 36.0 g/dL   RDW 13.4 11.5 - 15.5 %   Platelets 150 150 - 400 K/uL   Neutrophils Relative % 85 %   Lymphocytes Relative 7 %   Monocytes Relative 8 %   Eosinophils Relative 0 %   Basophils Relative 0 %   Neutro Abs 11.2 (H) 1.7 - 7.7 K/uL   Lymphs Abs 0.9 0.7 - 4.0 K/uL   Monocytes Absolute 1.0 0.1 - 1.0 K/uL   Eosinophils Absolute 0.0 0.0 - 0.7 K/uL   Basophils Absolute 0.0 0.0 - 0.1 K/uL   WBC Morphology ATYPICAL LYMPHOCYTES   Lipase, blood  Result Value Ref Range    Lipase 85 (H) 11 - 51 U/L  Troponin I  Result Value Ref Range   Troponin I <0.03 <0.031 ng/mL   Laboratory interpretation all normal except she now has a leukocytosis that was not present earlier today, she now has elevation of her LFTs that was not present earlier today, and her lipase is now elevated which was not present earlier today.     Imaging Review Dg Chest 2 View  07/30/2015  CLINICAL DATA:  Chest pain for 1 day EXAM: CHEST  2 VIEW COMPARISON:  None. FINDINGS: Lungs are clear. The heart size and pulmonary vascularity are normal. No adenopathy. No pneumothorax. No bone lesions. IMPRESSION: No edema or consolidation. Electronically Signed   By: Lowella Grip III M.D.   On: 07/30/2015 09:33   Ct Abdomen Pelvis W Contrast  07/31/2015  CLINICAL DATA:  70 year old female with abdominal pain and elevated liver enzymes. EXAM: CT ABDOMEN AND PELVIS WITH CONTRAST TECHNIQUE: Multidetector CT imaging of the abdomen and pelvis was performed using the standard protocol following bolus administration of intravenous contrast. CONTRAST:  38mL OMNIPAQUE IOHEXOL 300 MG/ML  SOLN COMPARISON:  None. FINDINGS: The visualized lung bases are clear. No intra-abdominal free air or free fluid. Apparent mild diffuse hepatic steatosis. The gallbladder is mildly distended. No calcified gallstone identified. The pancreas, spleen, adrenal glands, kidneys, visualized ureters and urinary bladder appear unremarkable. The uterus is anteverted. Multiple small exophytic/subserosal fibroids noted arising from the uterine fundus. The visualized ovaries are grossly unremarkable. A 2 cm diverticulum is noted arising from the medial wall of the second portion of the duodenum. There is mild haziness of the fat surrounding the second portion of the duodenum with mild apparent thickening of the duodenal wall. Findings concerning for duodenitis and less likely diverticulitis of the duodenum. Mild inflammatory changes are primarily  adjacent to the duodenum with less involvement of the head of the pancreas. However, in the presence of elevated lipase, primary pancreatitis with secondary inflammatory changes of the duodenum is not excluded. Clinical correlation recommended. There is sigmoid diverticulosis without active inflammatory changes. There is no evidence of bowel obstruction. Normal appendix. The abdominal aorta and IVC appear unremarkable. No portal venous gas identified. There is no adenopathy. The abdominal wall soft tissues appear unremarkable. The osseous structures are intact. IMPRESSION: Mild inflammatory changes of the second portion of the duodenum. A 2 cm diverticulum is noted arising from the medial aspect of the second portion of the duodenum. Findings likely represent duodenitis and less likely diverticulitis of the duodenum. Acute pancreatitis with secondary inflammatory changes of the duodenum is not excluded in the presence of elevated pancreatic enzymes. Correlation with clinical exam recommended. Electronically Signed   By: Anner Crete M.D.   On: 07/31/2015 06:53   I have personally reviewed and evaluated these images and lab results as part of my medical decision-making.   EKG Interpretation   Date/Time:  Saturday July 31 2015 03:29:58 EDT Ventricular Rate:  78 PR Interval:  125 QRS Duration: 97 QT Interval:  383 QTC Calculation: 436 R Axis:   78 Text Interpretation:  Sinus arrhythmia Normal ECG No significant change  since last tracing 30 July 2015 Confirmed by Kemani Demarais  MD-I, Early Steel ([id number]) on  07/31/2015 3:42:01 AM      MDM   Final diagnoses:  Epigastric abdominal pain  Acute pancreatitis, unspecified pancreatitis type  Diverticulitis, duodenum    Plan admission  Rolland Porter, MD, Barbette Or, MD 07/31/15 [phone]

## 2015-07-31 NOTE — H&P (Signed)
[facility] History and Physical  Desiree Flores [id number] DOB: June 13, 1945 DOA: 07/31/2015  Referring physician: Kathie Dike PCP: Gar Ponto, MD   Chief Complaint: emesis  HPI: Desiree Flores is a 70 y.o. female with a hx of diverticulitis and hypothyroidism who presents with complaints of emesis and epigastric abd pain. Pt initially presented to the RE yesterday with complaints of substernal chest discomfort. She had cardiac workup done that was found to be unremarkable. And she was subsequently discharged home. Prior to leaving the ER she had two episodes of emesis, which resolved prior to discharge. After returning home, she developed worsening epigastric discomfort and had several episodes of emesis. When her symptoms did not resolve, she came back to the ER for eval. She denies any fever, diarrhea. SOB, cough, or smoking. She has not had any sick contacts. She did eat [employer] prior to the onset of her symptoms.    While in the ED, workup revealed lipase 85, AST 127, ALT 124. Her troponin were wnl. WBC 13.1. [place] abd showed her RUQ as unremarkable. A CT abd revealed mild inflammatory changes of the second portion of th duodenum, with findings likely representing duodenitis.    Review of Systems:  Constitutional:  No weight loss, night sweats, Fevers, chills, fatigue.  HEENT:  No headaches, Difficulty swallowing,Tooth/dental problems,Sore throat,  No sneezing, itching, ear ache, nasal congestion, post nasal drip,  Cardio-vascular:  No chest pain, Orthopnea, PND, swelling in lower extremities, anasarca, dizziness, palpitations  GI:  No nausea, diarrhea, change in bowel habits. Positive for heartburn/ epigastric pain, indigestion, vomiting, and loss of appetite.  Resp:  No shortness of breath with exertion or at rest. No excess mucus, no productive cough, No non-productive cough, No coughing up of blood.No change in color of mucus.No wheezing.No chest wall deformity    Skin:  no rash or lesions.  GU:  no dysuria, change in color of urine, no urgency or frequency. No flank pain.  Musculoskeletal:  No joint pain or swelling. No decreased range of motion. No back pain.  Psych:  No change in mood or affect. No depression or anxiety. No memory loss.   Past Medical History  Diagnosis Date  . Diverticulitis   . Hypothyroidism    Past Surgical History  Procedure Laterality Date  . Dilation and curettage of uterus      miscarriage  . Appendectomy      6-7 yrs ago  . Colonoscopy    . Colonoscopy N/A 01/28/2014    Procedure: COLONOSCOPY;  Surgeon: Rogene Houston, MD;  Location: [facility];  Service: Endoscopy;  Laterality: N/A;  200   Social History:  reports that she has never smoked. She does not have any smokeless tobacco history on file. She reports that she does not drink alcohol or use illicit drugs.  Allergies  Allergen Reactions  . Sulfa Antibiotics     Family History  Problem Relation Age of Onset  . Colon cancer Other     Prior to Admission medications   Medication Sig Start Date End Date Taking? Authorizing Provider  Calcium Carbonate-Vit D-Min (CALCIUM 600+D3 PLUS MINERALS) 600-800 MG-UNIT TABS Take 1 capsule by mouth daily.   Yes Historical Provider, MD  glucosamine-chondroitin 500-400 MG tablet Take 2 tablets by mouth daily.    Yes Historical Provider, MD  ibuprofen (ADVIL,MOTRIN) 200 MG tablet Take 200 mg by mouth every 6 (six) hours as needed for moderate pain.    Yes Historical Provider, MD  levothyroxine (  SYNTHROID, LEVOTHROID) 100 MCG tablet Take 100 mcg by mouth daily before breakfast.   Yes Historical Provider, MD  Multiple Vitamins-Minerals ([employer]S SPECTRAVITE ADVANCED PO) Take 1 tablet by mouth daily.    Yes Historical Provider, MD  omeprazole (PRILOSEC) 20 MG capsule Take 1 capsule (20 mg total) by mouth 2 (two) times daily before a meal. 07/30/15  Yes Jola Schmidt, MD  ondansetron (ZOFRAN ODT) 8 MG disintegrating tablet  Take 1 tablet (8 mg total) by mouth every 8 (eight) hours as needed for nausea or vomiting. 07/30/15  Yes Jola Schmidt, MD   Physical Exam: Filed Vitals:   07/31/15 0430 07/31/15 0500 07/31/15 0630 07/31/15 0849  BP: 121/69 129/78 117/72 102/58  Pulse: 73 85 74 70  Temp:    97.8 F (36.6 C)  Resp: 14 14 13 16   Height:    5\' 7"  (1.702 m)  Weight:    68.04 kg (150 lb)  SpO2: 91% 93% 89% 93%    Wt Readings from Last 3 Encounters:  07/31/15 68.04 kg (150 lb)  07/30/15 68.04 kg (150 lb)  01/28/14 65.318 kg (144 lb)    General:  Appears calm and comfortable Eyes: PERRL, normal lids, irises & conjunctiva ENT: grossly normal hearing, lips & tongue Neck: no LAD, masses or thyromegaly Cardiovascular: RRR, no m/r/g. No LE edema.  Telemetry: SR, no arrhythmias  Respiratory: CTA bilaterally, no w/r/r. Normal respiratory effort. Abdomen: soft, ntnd  Skin: no rash or induration seen on limited exam Musculoskeletal: grossly normal tone BUE/BLE Psychiatric: grossly normal mood and affect, speech fluent and appropriate Neurologic: grossly non-focal.          Labs on Admission:  Basic Metabolic Panel:  Recent Labs Lab 07/30/15 0922 07/31/15 0322  NA 141 139  K 3.7 3.7  CL 104 105  CO2 27 26  GLUCOSE 118* 138*  BUN 22* 18  CREATININE 0.91 0.84  CALCIUM 10.3 9.1   Liver Function Tests:  Recent Labs Lab 07/30/15 0930 07/31/15 0322  AST 32 127*  ALT 53 124*  ALKPHOS 68 70  BILITOT 0.7 1.2  PROT 7.2 7.7  ALBUMIN 4.3 4.1    Recent Labs Lab 07/30/15 0930 07/31/15 0322  LIPASE 49 85*   No results for input(s): AMMONIA in the last 168 hours. CBC:  Recent Labs Lab 07/30/15 0922 07/31/15 0322  WBC 9.5 13.1*  NEUTROABS 7.0 11.2*  HGB 15.0 14.8  HCT 44.7 43.0  MCV 88.7 88.5  PLT 162 150   Cardiac Enzymes:  Recent Labs Lab 07/30/15 0922 07/31/15 0322  TROPONINI <0.03 <0.03    BNP (last 3 results) No results for input(s): BNP in the last 8760  hours.  ProBNP (last 3 results) No results for input(s): PROBNP in the last 8760 hours.  CBG: No results for input(s): GLUCAP in the last 168 hours.  Radiological Exams on Admission: Dg Chest 2 View  07/30/2015  CLINICAL DATA:  Chest pain for 1 day EXAM: CHEST  2 VIEW COMPARISON:  None. FINDINGS: Lungs are clear. The heart size and pulmonary vascularity are normal. No adenopathy. No pneumothorax. No bone lesions. IMPRESSION: No edema or consolidation. Electronically Signed   By: Lowella Grip III M.D.   On: 07/30/2015 09:33   Ct Abdomen Pelvis W Contrast  07/31/2015  CLINICAL DATA:  70 year old female with abdominal pain and elevated liver enzymes. EXAM: CT ABDOMEN AND PELVIS WITH CONTRAST TECHNIQUE: Multidetector CT imaging of the abdomen and pelvis was performed using the standard protocol following bolus  administration of intravenous contrast. CONTRAST:  29mL OMNIPAQUE IOHEXOL 300 MG/ML  SOLN COMPARISON:  None. FINDINGS: The visualized lung bases are clear. No intra-abdominal free air or free fluid. Apparent mild diffuse hepatic steatosis. The gallbladder is mildly distended. No calcified gallstone identified. The pancreas, spleen, adrenal glands, kidneys, visualized ureters and urinary bladder appear unremarkable. The uterus is anteverted. Multiple small exophytic/subserosal fibroids noted arising from the uterine fundus. The visualized ovaries are grossly unremarkable. A 2 cm diverticulum is noted arising from the medial wall of the second portion of the duodenum. There is mild haziness of the fat surrounding the second portion of the duodenum with mild apparent thickening of the duodenal wall. Findings concerning for duodenitis and less likely diverticulitis of the duodenum. Mild inflammatory changes are primarily adjacent to the duodenum with less involvement of the head of the pancreas. However, in the presence of elevated lipase, primary pancreatitis with secondary inflammatory changes of  the duodenum is not excluded. Clinical correlation recommended. There is sigmoid diverticulosis without active inflammatory changes. There is no evidence of bowel obstruction. Normal appendix. The abdominal aorta and IVC appear unremarkable. No portal venous gas identified. There is no adenopathy. The abdominal wall soft tissues appear unremarkable. The osseous structures are intact. IMPRESSION: Mild inflammatory changes of the second portion of the duodenum. A 2 cm diverticulum is noted arising from the medial aspect of the second portion of the duodenum. Findings likely represent duodenitis and less likely diverticulitis of the duodenum. Acute pancreatitis with secondary inflammatory changes of the duodenum is not excluded in the presence of elevated pancreatic enzymes. Correlation with clinical exam recommended. Electronically Signed   By: Anner Crete M.D.   On: 07/31/2015 06:53    EKG: Independently reviewed. Does not show any acute findings.  Assessment/Plan Active Problems:   Pancreatitis   1. Emesis and epigastric pain. Concerns for diverticulitis vs pancreatitis. Pt does not drink any alcohol. Abd [place] did not reveal any gallstones. Lipase is only mildly elevated at 85. Will start her on IV abx. Clear liquids and advance diet as tolerated. Continue with antiemetics. Clinically she is feeling improved. If symptoms persist, she may need Gi input. 2. Elevated LFTs, possibly reactive to duodenitis/pancraeatitis. Will repeat in am. If remain elevated, will need further workup. 3. Hypothyroidism. Continue synthroid 4. GERD, continue PPI    Code Status: Full DVT Prophylaxis: Lovenox Family Communication: Husband present at bedside Disposition Plan: discharge once improved  Time spent: 33 minutes  Kathie Dike, MD. [facility] Pager [phone]   By signing my name below, I, Delene Ruffini, attest that this documentation has been prepared under the direction and in the  presence of Kathie Dike, MD. Electronically Signed: Delene Ruffini 07/31/2015  1:00pm   I, Dr. Kathie Dike, personally performed the services described in this documentaiton. All medical record entries made by the scribe were at my direction and in my presence. I have reviewed the chart and agree that the record reflects my personal performance and is accurate and complete  Kathie Dike, MD, 07/31/2015 1:29 PM

## 2015-07-31 NOTE — ED Notes (Signed)
Pt states she was seen here in the e.d. For chest pain, had a complete workup for same, sent home with rx for prilosec which she started taking but has been vomiting since then.

## 2015-07-31 NOTE — ED Notes (Signed)
Pt drinking contrast and vomited it back up, clear/yellow. Dr. Tomi Bamberger notified of the same, called CT to come and get the patient for IV contrast ct

## 2015-07-31 NOTE — ED Notes (Signed)
MD at bedside. 

## 2015-07-31 NOTE — ED Notes (Signed)
Patient transported to CT 

## 2015-07-31 NOTE — ED Notes (Signed)
Pt states she was seen here earlier for cp in the ED. Prior to leaving she started vomiting and was given zofran. Pt says she went home and has kept vomiting. Reports upper abdominal pain, multiple episodes of vomiting, denies diarrhea or fevers.

## 2015-08-01 DIAGNOSIS — K2980 Duodenitis without bleeding: Secondary | ICD-10-CM | POA: Diagnosis present

## 2015-08-01 DIAGNOSIS — K859 Acute pancreatitis without necrosis or infection, unspecified: Secondary | ICD-10-CM | POA: Diagnosis present

## 2015-08-01 LAB — CBC
HCT: 36.8 % (ref 36.0–46.0)
Hemoglobin: 12.2 g/dL (ref 12.0–15.0)
MCH: 29.8 pg (ref 26.0–34.0)
MCHC: 33.2 g/dL (ref 30.0–36.0)
MCV: 89.8 fL (ref 78.0–100.0)
PLATELETS: 134 10*3/uL — AB (ref 150–400)
RBC: 4.1 MIL/uL (ref 3.87–5.11)
RDW: 13.6 % (ref 11.5–15.5)
WBC: 7.9 10*3/uL (ref 4.0–10.5)

## 2015-08-01 LAB — LIPASE, BLOOD: Lipase: 60 U/L — ABNORMAL HIGH (ref 11–51)

## 2015-08-01 LAB — COMPREHENSIVE METABOLIC PANEL
ALT: 73 U/L — AB (ref 14–54)
AST: 38 U/L (ref 15–41)
Albumin: 3.1 g/dL — ABNORMAL LOW (ref 3.5–5.0)
Alkaline Phosphatase: 54 U/L (ref 38–126)
Anion gap: 6 (ref 5–15)
BUN: 11 mg/dL (ref 6–20)
CHLORIDE: 109 mmol/L (ref 101–111)
CO2: 23 mmol/L (ref 22–32)
CREATININE: 0.84 mg/dL (ref 0.44–1.00)
Calcium: 8 mg/dL — ABNORMAL LOW (ref 8.9–10.3)
GFR calc Af Amer: >60 mL/min (ref >60)
Glucose, Bld: 101 mg/dL — ABNORMAL HIGH (ref 65–99)
Potassium: 3.5 mmol/L (ref 3.5–5.1)
SODIUM: 138 mmol/L (ref 135–145)
Total Bilirubin: 0.8 mg/dL (ref 0.3–1.2)
Total Protein: 6 g/dL — ABNORMAL LOW (ref 6.5–8.1)

## 2015-08-01 MED ORDER — PROMETHAZINE HCL 25 MG/ML IJ SOLN
12.5000 mg | Freq: Once | INTRAMUSCULAR | Status: AC
  Administered 2015-08-01: 12.5 mg via INTRAVENOUS
  Filled 2015-08-01: qty 1

## 2015-08-01 MED ORDER — SODIUM CHLORIDE 0.9 % IV BOLUS (SEPSIS)
1000.0000 mL | Freq: Once | INTRAVENOUS | Status: AC
  Administered 2015-08-01: 1000 mL via INTRAVENOUS

## 2015-08-01 NOTE — Progress Notes (Signed)
1953 Patient c/o severe nausea, had PRN Zofran Q6H @ 1619. Mid-level notified and an additional PRN nausea medication requested. Patient aware.

## 2015-08-01 NOTE — Progress Notes (Signed)
[facility] PROGRESS NOTE  Desiree Flores [id number] DOB: July 20, 1945 DOA: 07/31/2015 PCP: Gar Ponto, MD  Assessment/Plan: #1 emesis and epigastric pain likely secondary to acute pancreatitis versus duodenitis Patient admitted with emesis and epigastric pain CT abdomen and pelvis concerning for acute pancreatitis versus duodenitis. Abdominal ultrasound negative for cholelithiasis and unremarkable. Lipase levels were elevated at 85 and trending down and currently at 60. LFTs trending down. Patient currently afebrile. Leukocytosis trending down. Clinical improvement. Continue clear liquids. 1 L normal saline bolus. Increase IV fluids 150 mL per hour. Antiemetics. Repeat lipase levels in the morning. Check a fasting lipid panel. Supportive care. PPI.  #2 transaminitis/elevated LFTs Likely reactive secondary to pancreatitis versus duodenitis. LFTs trending down. Abdominal ultrasound unremarkable. Follow.  #3 gastroesophageal reflux disease PPI.  #4 hypothyroidism Continue Synthroid.  #5 prophylaxis   PPI for GI prophylaxis. Lovenox for DVT prophylaxis.  Code Status: Full Family Communication: Updated patient and husband at bedside. Disposition Plan: Home when abdominal pain has resolved patient tolerating oral intake and oral antibiotics hopefully in 1-2 days.   Consultants:  None  Antibiotics:  IV ciprofloxacin 07/31/2015  IV Flagyl 07/31/2015  : procedures   CT abdomen and pelvis 07/31/2015  Chest x-ray 07/30/2015  Abdominal ultrasound 07/31/2015  HPI/Subjective: Patient states some improvement with abdominal pain. Patient with complaints of nausea. No emesis.  Objective: Filed Vitals:   07/31/15 2049 08/01/15 0613  BP: 121/61 106/56  Pulse: 77 74  Temp: 99.4 F (37.4 C) 98.4 F (36.9 C)  Resp: 18 15    Intake/Output Summary (Last 24 hours) at 08/01/15 1234 Last data filed at 08/01/15 0900  Gross per 24 hour  Intake   2670 ml  Output      0  ml  Net   2670 ml   Filed Weights   07/31/15 0849 07/31/15 0940  Weight: 68.04 kg (150 lb) 68.04 kg (150 lb)    Exam:   General:  NAD  Cardiovascular: RRR  Respiratory: CTAB  Abdomen: Soft/ND/less TTP/+BS  Musculoskeletal: No c/c/e  Data Reviewed: Basic Metabolic Panel:  Recent Labs Lab 07/30/15 0922 07/31/15 0322 08/01/15 0637  NA 141 139 138  K 3.7 3.7 3.5  CL 104 105 109  CO2 27 26 23   GLUCOSE 118* 138* 101*  BUN 22* 18 11  CREATININE 0.91 0.84 0.84  CALCIUM 10.3 9.1 8.0*   Liver Function Tests:  Recent Labs Lab 07/30/15 0930 07/31/15 0322 08/01/15 0637  AST 32 127* 38  ALT 53 124* 73*  ALKPHOS 68 70 54  BILITOT 0.7 1.2 0.8  PROT 7.2 7.7 6.0*  ALBUMIN 4.3 4.1 3.1*    Recent Labs Lab 07/30/15 0930 07/31/15 0322 08/01/15 0637  LIPASE 49 85* 60*   No results for input(s): AMMONIA in the last 168 hours. CBC:  Recent Labs Lab 07/30/15 0922 07/31/15 0322 08/01/15 0637  WBC 9.5 13.1* 7.9  NEUTROABS 7.0 11.2*  --   HGB 15.0 14.8 12.2  HCT 44.7 43.0 36.8  MCV 88.7 88.5 89.8  PLT 162 150 134*   Cardiac Enzymes:  Recent Labs Lab 07/30/15 0922 07/31/15 0322  TROPONINI <0.03 <0.03   BNP (last 3 results) No results for input(s): BNP in the last 8760 hours.  ProBNP (last 3 results) No results for input(s): PROBNP in the last 8760 hours.  CBG: No results for input(s): GLUCAP in the last 168 hours.  No results found for this or any previous visit (from the past 240 hour(s)).   Studies:  Ct Abdomen Pelvis W Contrast  07/31/2015  CLINICAL DATA:  70 year old female with abdominal pain and elevated liver enzymes. EXAM: CT ABDOMEN AND PELVIS WITH CONTRAST TECHNIQUE: Multidetector CT imaging of the abdomen and pelvis was performed using the standard protocol following bolus administration of intravenous contrast. CONTRAST:  71mL OMNIPAQUE IOHEXOL 300 MG/ML  SOLN COMPARISON:  None. FINDINGS: The visualized lung bases are clear. No  intra-abdominal free air or free fluid. Apparent mild diffuse hepatic steatosis. The gallbladder is mildly distended. No calcified gallstone identified. The pancreas, spleen, adrenal glands, kidneys, visualized ureters and urinary bladder appear unremarkable. The uterus is anteverted. Multiple small exophytic/subserosal fibroids noted arising from the uterine fundus. The visualized ovaries are grossly unremarkable. A 2 cm diverticulum is noted arising from the medial wall of the second portion of the duodenum. There is mild haziness of the fat surrounding the second portion of the duodenum with mild apparent thickening of the duodenal wall. Findings concerning for duodenitis and less likely diverticulitis of the duodenum. Mild inflammatory changes are primarily adjacent to the duodenum with less involvement of the head of the pancreas. However, in the presence of elevated lipase, primary pancreatitis with secondary inflammatory changes of the duodenum is not excluded. Clinical correlation recommended. There is sigmoid diverticulosis without active inflammatory changes. There is no evidence of bowel obstruction. Normal appendix. The abdominal aorta and IVC appear unremarkable. No portal venous gas identified. There is no adenopathy. The abdominal wall soft tissues appear unremarkable. The osseous structures are intact. IMPRESSION: Mild inflammatory changes of the second portion of the duodenum. A 2 cm diverticulum is noted arising from the medial aspect of the second portion of the duodenum. Findings likely represent duodenitis and less likely diverticulitis of the duodenum. Acute pancreatitis with secondary inflammatory changes of the duodenum is not excluded in the presence of elevated pancreatic enzymes. Correlation with clinical exam recommended. Electronically Signed   By: Anner Crete M.D.   On: 07/31/2015 06:53   US Abdomen Limited Ruq  07/31/2015  CLINICAL DATA:  Epigastric pain, elevated LFTs,  possible gallstones EXAM: US ABDOMEN LIMITED - RIGHT UPPER QUADRANT COMPARISON:  CT scan 07/31/2015 FINDINGS: Gallbladder: No gallstones or wall thickening visualized. No sonographic Murphy sign noted by sonographer. Common bile duct: Diameter: 4.3 mm in diameter within normal limits. Liver: No focal lesion identified. Within normal limits in parenchymal echogenicity. IMPRESSION: Unremarkable right upper quadrant ultrasound. Electronically Signed   By: Lahoma Crocker M.D.   On: 07/31/2015 11:02    Scheduled Meds: . ciprofloxacin  400 mg Intravenous Q12H  . enoxaparin (LOVENOX) injection  40 mg Subcutaneous Q24H  . levothyroxine  100 mcg Oral QAC breakfast  . metronidazole  500 mg Intravenous Q8H  . pantoprazole (PROTONIX) IV  40 mg Intravenous Q12H  . sodium chloride  1,000 mL Intravenous Once   Continuous Infusions: . sodium chloride 100 mL/hr at 08/01/15 1036    Principal Problem:   Epigastric abdominal pain Active Problems:   Pancreatitis   Diverticulitis   GERD (gastroesophageal reflux disease)   Hypothyroidism   Diverticulitis, duodenum    Time spent: [place] MD [facility] Pager [phone]. If 7PM-7AM, please contact night-coverage at [email or web address], password [facility] 08/01/2015, 12:34 PM  LOS: 1 day

## 2015-08-02 DIAGNOSIS — K859 Acute pancreatitis without necrosis or infection, unspecified: Secondary | ICD-10-CM

## 2015-08-02 DIAGNOSIS — R112 Nausea with vomiting, unspecified: Secondary | ICD-10-CM

## 2015-08-02 DIAGNOSIS — R1013 Epigastric pain: Secondary | ICD-10-CM

## 2015-08-02 DIAGNOSIS — K5712 Diverticulitis of small intestine without perforation or abscess without bleeding: Secondary | ICD-10-CM

## 2015-08-02 LAB — COMPREHENSIVE METABOLIC PANEL
ALT: 55 U/L — AB (ref 14–54)
AST: 26 U/L (ref 15–41)
Albumin: 3 g/dL — ABNORMAL LOW (ref 3.5–5.0)
Alkaline Phosphatase: 54 U/L (ref 38–126)
Anion gap: 7 (ref 5–15)
BILIRUBIN TOTAL: 0.6 mg/dL (ref 0.3–1.2)
BUN: 11 mg/dL (ref 6–20)
CALCIUM: 8.3 mg/dL — AB (ref 8.9–10.3)
CO2: 21 mmol/L — ABNORMAL LOW (ref 22–32)
CREATININE: 0.81 mg/dL (ref 0.44–1.00)
Chloride: 115 mmol/L — ABNORMAL HIGH (ref 101–111)
Glucose, Bld: 94 mg/dL (ref 65–99)
Potassium: 3.6 mmol/L (ref 3.5–5.1)
Sodium: 143 mmol/L (ref 135–145)
TOTAL PROTEIN: 6 g/dL — AB (ref 6.5–8.1)

## 2015-08-02 LAB — LIPID PANEL
CHOL/HDL RATIO: 2.7 ratio
Cholesterol: 104 mg/dL (ref 0–200)
HDL: 39 mg/dL — AB (ref >40)
LDL Cholesterol: 56 mg/dL (ref 0–99)
TRIGLYCERIDES: 45 mg/dL (ref <150)
VLDL: 9 mg/dL (ref 0–40)

## 2015-08-02 LAB — CBC
HCT: 36.2 % (ref 36.0–46.0)
Hemoglobin: 12.2 g/dL (ref 12.0–15.0)
MCH: 30 pg (ref 26.0–34.0)
MCHC: 33.7 g/dL (ref 30.0–36.0)
MCV: 88.9 fL (ref 78.0–100.0)
PLATELETS: 138 10*3/uL — AB (ref 150–400)
RBC: 4.07 MIL/uL (ref 3.87–5.11)
RDW: 13.2 % (ref 11.5–15.5)
WBC: 6.3 10*3/uL (ref 4.0–10.5)

## 2015-08-02 LAB — LIPASE, BLOOD: LIPASE: 45 U/L (ref 11–51)

## 2015-08-02 MED ORDER — PROMETHAZINE HCL 25 MG/ML IJ SOLN
12.5000 mg | Freq: Four times a day (QID) | INTRAMUSCULAR | Status: DC | PRN
  Administered 2015-08-02 (×2): 12.5 mg via INTRAVENOUS
  Filled 2015-08-02 (×2): qty 1

## 2015-08-02 MED ORDER — HYDROMORPHONE HCL 1 MG/ML IJ SOLN: 0.5000 mg | INTRAMUSCULAR | Status: DC | PRN

## 2015-08-02 MED ORDER — METOCLOPRAMIDE HCL 5 MG/ML IJ SOLN
10.0000 mg | Freq: Four times a day (QID) | INTRAMUSCULAR | Status: AC
  Administered 2015-08-02 – 2015-08-03 (×3): 10 mg via INTRAVENOUS
  Filled 2015-08-02 (×3): qty 2

## 2015-08-02 MED ORDER — SODIUM CHLORIDE 0.9 % IV SOLN
INTRAVENOUS | Status: DC
  Administered 2015-08-03: 1000 mL via INTRAVENOUS

## 2015-08-02 MED ORDER — PROCHLORPERAZINE EDISYLATE 5 MG/ML IJ SOLN
10.0000 mg | Freq: Four times a day (QID) | INTRAMUSCULAR | Status: DC | PRN
  Administered 2015-08-02 – 2015-08-03 (×4): 10 mg via INTRAVENOUS
  Filled 2015-08-02 (×5): qty 2

## 2015-08-02 NOTE — Progress Notes (Signed)
[facility] PROGRESS NOTE  Desiree Flores [id number] DOB: May 22, 1945 DOA: 07/31/2015 PCP: Gar Ponto, MD  Assessment/Plan: #1 emesis and epigastric pain likely secondary to acute pancreatitis versus duodenitis Patient admitted with emesis and epigastric pain CT abdomen and pelvis concerning for acute pancreatitis versus duodenitis. Abdominal ultrasound negative for cholelithiasis and unremarkable. Lipase levels were elevated at 85 and trending down and currently at 45. LFTs trending down. Fasting lipid panel with a triglyceride level of 45. Patient currently afebrile. Leukocytosis trending down. Patient with complaints of significant nausea. Will place on bowel rest with ice chips. Decrease IV fluid rate to 125 mL. Place on Compazine when necessary. Repeat labs in the morning. Consult with GI for further evaluation and management. Supportive care.   #2 transaminitis/elevated LFTs Likely reactive secondary to pancreatitis versus duodenitis. LFTs trending down. Abdominal ultrasound unremarkable. Follow.  #3 gastroesophageal reflux disease PPI.  #4 hypothyroidism Continue Synthroid.  #5 prophylaxis   PPI for GI prophylaxis. Lovenox for DVT prophylaxis.  Code Status: Full Family Communication: Updated patient and husband at bedside. Disposition Plan: Per GI   Consultants:  GI: DR Laural Golden 08/02/2015  Antibiotics:  IV ciprofloxacin 07/31/2015  IV Flagyl 07/31/2015  : procedures   CT abdomen and pelvis 07/31/2015  Chest x-ray 07/30/2015  Abdominal ultrasound 07/31/2015  HPI/Subjective: Patient states some improvement with abdominal pain. Patient with complaints of nausea. No emesis. Patient and husband insistent on GI consultation.  Objective: Filed Vitals:   08/02/15 0529 08/02/15 1356  BP: 113/82 130/64  Pulse: 65 66  Temp: 98.3 F (36.8 C) 99.6 F (37.6 C)  Resp: 18 18    Intake/Output Summary (Last 24 hours) at 08/02/15 1922 Last data filed at  08/02/15 1805  Gross per 24 hour  Intake      0 ml  Output   1925 ml  Net  -1925 ml   Filed Weights   07/31/15 0849 07/31/15 0940  Weight: 68.04 kg (150 lb) 68.04 kg (150 lb)    Exam:   General:  NAD  Cardiovascular: RRR  Respiratory: CTAB  Abdomen: Soft/ND/less TTP/+BS  Musculoskeletal: No c/c/e  Data Reviewed: Basic Metabolic Panel:  Recent Labs Lab 07/30/15 0922 07/31/15 0322 08/01/15 0637 08/02/15 0548  NA 141 139 138 143  K 3.7 3.7 3.5 3.6  CL 104 105 109 115*  CO2 27 26 23  21*  GLUCOSE 118* 138* 101* 94  BUN 22* 18 11 11   CREATININE 0.91 0.84 0.84 0.81  CALCIUM 10.3 9.1 8.0* 8.3*   Liver Function Tests:  Recent Labs Lab 07/30/15 0930 07/31/15 0322 08/01/15 0637 08/02/15 0548  AST 32 127* 38 26  ALT 53 124* 73* 55*  ALKPHOS 68 70 54 54  BILITOT 0.7 1.2 0.8 0.6  PROT 7.2 7.7 6.0* 6.0*  ALBUMIN 4.3 4.1 3.1* 3.0*    Recent Labs Lab 07/30/15 0930 07/31/15 0322 08/01/15 0637 08/02/15 0548  LIPASE 49 85* 60* 45   No results for input(s): AMMONIA in the last 168 hours. CBC:  Recent Labs Lab 07/30/15 0922 07/31/15 0322 08/01/15 0637 08/02/15 0548  WBC 9.5 13.1* 7.9 6.3  NEUTROABS 7.0 11.2*  --   --   HGB 15.0 14.8 12.2 12.2  HCT 44.7 43.0 36.8 36.2  MCV 88.7 88.5 89.8 88.9  PLT 162 150 134* 138*   Cardiac Enzymes:  Recent Labs Lab 07/30/15 0922 07/31/15 0322  TROPONINI <0.03 <0.03   BNP (last 3 results) No results for input(s): BNP in the last 8760 hours.  ProBNP (last 3 results) No results for input(s): PROBNP in the last 8760 hours.  CBG: No results for input(s): GLUCAP in the last 168 hours.  No results found for this or any previous visit (from the past 240 hour(s)).   Studies: No results found.  Scheduled Meds: . ciprofloxacin  400 mg Intravenous Q12H  . levothyroxine  100 mcg Oral QAC breakfast  . metoCLOPramide (REGLAN) injection  10 mg Intravenous 4 times per day  . metronidazole  500 mg Intravenous Q8H   . pantoprazole (PROTONIX) IV  40 mg Intravenous Q12H   Continuous Infusions: . sodium chloride 125 mL/hr at 08/02/15 [id number]    Principal Problem:   Epigastric abdominal pain Active Problems:   Pancreatitis   Diverticulitis   GERD (gastroesophageal reflux disease)   Hypothyroidism   Diverticulitis, duodenum   Pancreatitis, acute   Duodenitis    Time spent: [place] MD [facility] Pager [phone]. If 7PM-7AM, please contact night-coverage at [email or web address], password [facility] 08/02/2015, 7:22 PM  LOS: 2 days

## 2015-08-02 NOTE — Consult Note (Signed)
Referring Provider: Irine Seal, MD Primary Care Physician:  Gar Ponto, MD Primary Gastroenterologist:  Dr. Laural Golden  Reason for Consultation:    Nausea vomiting and epigastric pain. Elevated transaminases and lipase on admission.  HPI:   Patient is 70 year old Caucasian female who was present illness began on the evening of 07/29/2015 when she developed retrosternal pain. She recalls she had fish sandwich from [employer]. She did not experience nausea vomiting heartburn fever chills shortness of breath or diaphoresis. She decided to sleep in recliner. Next morning which is 07/30/2015 she came to emergency room. Her lab studies were normal including troponin level lipase and LFTs. EKG and chest film were also normal. Patient's chest pain was felt to be secondary to GERD and she was given prescription for Prilosec. While she was getting me to leave she began vomiting. She was therefore capped in emergency room for few more hours and she seemed to settle down. She went home that afternoon but did not feel well. She states it took her 1 hour to eat bald egg. Chief felt very nauseated and now began to experience epigastric as well as chest pain. She did Prilosec but it didn't help. She therefore return to emergency room on 07/31/2015 and not noted to have elevated serum lipase and LFTs. Abdominopelvic CT and ultrasound were obtained and she was hospitalized. It was felt she may have duodenal diverticulitis. She was begun on Cipro and metronidazole. She states her pain has resolved but her nausea has not. She tried to drink ginger ale yesterday and she began vomiting. She feels miserable. She is nauseated and afraid to eat. She is been passing flatus. She denies hematemesis melena or rectal bleeding. She had a bowel movement for 5 days ago. She has not lost any weight recently. There is no history of peptic ulcer disease or chronic heartburn. She does take glucosamine daily and OTC ibuprofen on  as-needed basis for chronic right knee pain and she has been told by Dr. Gar Ponto that she has osteoarthrosis. Family history significant for CAD and CVA in father who died at 50. Mother had depression and she lived to be 74. She has 2 sisters ages 6 and 38 who are in  good health. She is retired Radio producer. She has never smoked cigarettes or drank alcohol. She is married but does not have any children.   Past Medical History  Diagnosis Date  . Diverticulitis in July 2015.    Marland Kitchen Hypothyroidism of more than 20 years.  Osteoarthrosis of right knee.      Past Surgical History  Procedure Laterality Date  . Dilation and curettage of uterus      miscarriage  . Appendectomy      6-7 yrs ago  . Colonoscopy    . Colonoscopy N/A 01/28/2014    Procedure: COLONOSCOPY;  Surgeon: Rogene Houston, MD;  Location: [facility];  Service: Endoscopy;  Laterality: N/A;  200    Prior to Admission medications   Medication Sig Start Date End Date Taking? Authorizing Provider  Calcium Carbonate-Vit D-Min (CALCIUM 600+D3 PLUS MINERALS) 600-800 MG-UNIT TABS Take 1 capsule by mouth daily.   Yes Historical Provider, MD  glucosamine-chondroitin 500-400 MG tablet Take 2 tablets by mouth daily.    Yes Historical Provider, MD  ibuprofen (ADVIL,MOTRIN) 200 MG tablet Take 200 mg by mouth every 6 (six) hours as needed for moderate pain.    Yes Historical Provider, MD  levothyroxine (SYNTHROID, LEVOTHROID) 100 MCG tablet Take 100 mcg by mouth  daily before breakfast.   Yes Historical Provider, MD  Multiple Vitamins-Minerals ([employer] SPECTRAVITE ADVANCED PO) Take 1 tablet by mouth daily.    Yes Historical Provider, MD  omeprazole (PRILOSEC) 20 MG capsule Take 1 capsule (20 mg total) by mouth 2 (two) times daily before a meal. 07/30/15  Yes Jola Schmidt, MD  ondansetron (ZOFRAN ODT) 8 MG disintegrating tablet Take 1 tablet (8 mg total) by mouth every 8 (eight) hours as needed for nausea or vomiting. 07/30/15  Yes Jola Schmidt, MD    Current Facility-Administered Medications  Medication Dose Route Frequency Provider Last Rate Last Dose  . 0.9 %  sodium chloride infusion   Intravenous Continuous Eugenie Filler, MD 125 mL/hr at 08/02/15 [phone]    . acetaminophen (TYLENOL) tablet 650 mg  650 mg Oral Q6H PRN Kathie Dike, MD       Or  . acetaminophen (TYLENOL) suppository 650 mg  650 mg Rectal Q6H PRN Kathie Dike, MD      . ciprofloxacin (CIPRO) IVPB 400 mg  400 mg Intravenous Q12H Kathie Dike, MD   400 mg at 08/02/15 1533  . HYDROmorphone (DILAUDID) injection 0.5 mg  0.5 mg Intravenous Q3H PRN Rogene Houston, MD      . levothyroxine (SYNTHROID, LEVOTHROID) tablet 100 mcg  100 mcg Oral QAC breakfast Kathie Dike, MD   100 mcg at 08/01/15 0827  . metoCLOPramide (REGLAN) injection 10 mg  10 mg Intravenous 4 times per day Rogene Houston, MD      . metroNIDAZOLE (FLAGYL) IVPB 500 mg  500 mg Intravenous Q8H Kathie Dike, MD   500 mg at 08/02/15 1355  . ondansetron (ZOFRAN) tablet 4 mg  4 mg Oral Q6H PRN Kathie Dike, MD       Or  . ondansetron (ZOFRAN) injection 4 mg  4 mg Intravenous Q6H PRN Kathie Dike, MD   4 mg at 08/02/15 0354  . pantoprazole (PROTONIX) injection 40 mg  40 mg Intravenous Q12H Kathie Dike, MD   40 mg at 08/02/15 0926  . prochlorperazine (COMPAZINE) injection 10 mg  10 mg Intravenous Q6H PRN Eugenie Filler, MD   10 mg at 08/02/15 1533  . promethazine (PHENERGAN) injection 12.5 mg  12.5 mg Intravenous Q6H PRN Phillips Grout, MD   12.5 mg at 08/02/15 0509    Allergies as of 07/31/2015 - Review Complete 07/31/2015  Allergen Reaction Noted  . Sulfa antibiotics  12/15/2013    Family History  Problem Relation Age of Onset  . Colon cancer Other     Social History   Social History  . Marital Status: Married    Spouse Name: N/A  . Number of Children: N/A  . Years of Education: N/A   Occupational History  . Not on file.   Social History Main Topics  . Smoking  status: Never Smoker   . Smokeless tobacco: Not on file  . Alcohol Use: No  . Drug Use: No  . Sexual Activity: Not Currently   Other Topics Concern  . Not on file   Social History Narrative    Review of Systems: See HPI, otherwise normal ROS  Physical Exam: Temp:  [98.3 F (36.8 C)-99.6 F (37.6 C)] 99.6 F (37.6 C) (03/27 1356) Pulse Rate:  [65-75] 66 (03/27 1356) Resp:  [15-18] 18 (03/27 1356) BP: (113-138)/(64-95) 130/64 mmHg (03/27 1356) SpO2:  [95 %-97 %] 95 % (03/27 1356) Last BM Date: 07/29/15     Lab Results:  Recent Labs  07/31/15 0322 08/01/15 0637 08/02/15 0548  WBC 13.1* 7.9 6.3  HGB 14.8 12.2 12.2  HCT 43.0 36.8 36.2  PLT 150 134* 138*   BMET  Recent Labs  07/31/15 0322 08/01/15 0637 08/02/15 0548  NA 139 138 143  K 3.7 3.5 3.6  CL 105 109 115*  CO2 26 23 21*  GLUCOSE 138* 101* 94  BUN 18 11 11   CREATININE 0.84 0.84 0.81  CALCIUM 9.1 8.0* 8.3*   LFT  Recent Labs  08/02/15 0548  PROT 6.0*  ALBUMIN 3.0*  AST 26  ALT 55*  ALKPHOS 54  BILITOT 0.6   Abdominal pelvic CT from 07/31/2014 reviewed. Study performed with contrast. It reveals large duodenal diverticulum involving the medial aspect second part of duodenum measuring 2 cm in diameter with mild inflammatory changes involving duodenal mucosa. No evidence of cholelithiasis or dilated bile duct. No peripancreatic inflammatory changes noted. Sigmoid colon diverticulosis.  Upper abdominal ultrasound from 07/31/2014 reviewed. No evidence of cholelithiasis and CBD measures 4.3 mm.  Assessment;  Patient is 70 year old Caucasian female who presents with recent onset of upper abdominal and retrosternal pain mildly elevated transaminases and serum lipase. Imaging studies negative for cholelithiasis or choledocholithiasis revealed large duodenal diverticulum and focal changes of duodenal wall thickening/duodenitis. CT does not show typical changes of pancreatitis which changes seen could  be due to mild pancreatitis. Transaminases have have normalized except mildly elevated ALT and serum lipase is also normal. However symptomatically she is not improving. I suspect primary process is pancreatitis. She may have passed a small stone. Since she has intractable nausea need to rule out peptic ulcer disease. Nausea could also be secondary to metronidazole. If indeed she has duodenitis this can be confirmed by EGD.  Recommendations;  Metoclopramide 10 mg IV every 64 doses. Change analgesics from morphine to hydromorphone which seemed to have less effect on SOD. Please note that she has not required analgesic since admission. Repeat lab in a.m. Diagnostic esophagogastroduodenoscopy on 08/03/2015.   LOS: 2 days   REHMAN,NAJEEB U  08/02/2015, 6:00 PM

## 2015-08-02 NOTE — Progress Notes (Signed)
0412 c/o severe nausea, PRN Zofran given as ordered & wasn't effective. Dr.David notified and new order for Phenergan 12.5mg  IV Q6H given. Patient aware.

## 2015-08-03 ENCOUNTER — Encounter (HOSPITAL_COMMUNITY)
Admission: EM | Disposition: A | Discharge status: Home / Self Care | Payer: Self-pay | Source: Home / Self Care | Attending: Internal Medicine

## 2015-08-03 ENCOUNTER — Encounter (HOSPITAL_COMMUNITY): Payer: Self-pay | Admitting: *Deleted

## 2015-08-03 DIAGNOSIS — R1310 Dysphagia, unspecified: Secondary | ICD-10-CM

## 2015-08-03 DIAGNOSIS — Q394 Esophageal web: Secondary | ICD-10-CM

## 2015-08-03 DIAGNOSIS — K449 Diaphragmatic hernia without obstruction or gangrene: Secondary | ICD-10-CM

## 2015-08-03 DIAGNOSIS — K222 Esophageal obstruction: Secondary | ICD-10-CM

## 2015-08-03 DIAGNOSIS — K2950 Unspecified chronic gastritis without bleeding: Secondary | ICD-10-CM

## 2015-08-03 HISTORY — PX: ESOPHAGOGASTRODUODENOSCOPY: SHX5428

## 2015-08-03 HISTORY — PX: ESOPHAGEAL DILATION: SHX303

## 2015-08-03 LAB — CBC
HEMATOCRIT: 36.4 % (ref 36.0–46.0)
Hemoglobin: 12.1 g/dL (ref 12.0–15.0)
MCH: 29.1 pg (ref 26.0–34.0)
MCHC: 33.2 g/dL (ref 30.0–36.0)
MCV: 87.5 fL (ref 78.0–100.0)
Platelets: 144 10*3/uL — ABNORMAL LOW (ref 150–400)
RBC: 4.16 MIL/uL (ref 3.87–5.11)
RDW: 13 % (ref 11.5–15.5)
WBC: 5.6 10*3/uL (ref 4.0–10.5)

## 2015-08-03 LAB — COMPREHENSIVE METABOLIC PANEL
ALT: 46 U/L (ref 14–54)
AST: 24 U/L (ref 15–41)
Albumin: 3 g/dL — ABNORMAL LOW (ref 3.5–5.0)
Alkaline Phosphatase: 51 U/L (ref 38–126)
Anion gap: 9 (ref 5–15)
BUN: 13 mg/dL (ref 6–20)
CHLORIDE: 112 mmol/L — AB (ref 101–111)
CO2: 19 mmol/L — ABNORMAL LOW (ref 22–32)
Calcium: 8.2 mg/dL — ABNORMAL LOW (ref 8.9–10.3)
Creatinine, Ser: 0.85 mg/dL (ref 0.44–1.00)
GFR calc Af Amer: >60 mL/min (ref >60)
Glucose, Bld: 90 mg/dL (ref 65–99)
POTASSIUM: 3.4 mmol/L — AB (ref 3.5–5.1)
SODIUM: 140 mmol/L (ref 135–145)
Total Bilirubin: 0.8 mg/dL (ref 0.3–1.2)
Total Protein: 6.1 g/dL — ABNORMAL LOW (ref 6.5–8.1)

## 2015-08-03 LAB — LIPASE, BLOOD: Lipase: 47 U/L (ref 11–51)

## 2015-08-03 SURGERY — EGD (ESOPHAGOGASTRODUODENOSCOPY)
Anesthesia: Moderate Sedation

## 2015-08-03 MED ORDER — MIDAZOLAM HCL 5 MG/5ML IJ SOLN
INTRAMUSCULAR | Status: DC | PRN
  Administered 2015-08-03: 2 mg via INTRAVENOUS
  Administered 2015-08-03 (×2): 1 mg via INTRAVENOUS
  Administered 2015-08-03: 2 mg via INTRAVENOUS

## 2015-08-03 MED ORDER — MEPERIDINE HCL 50 MG/ML IJ SOLN
INTRAMUSCULAR | Status: AC
  Filled 2015-08-03: qty 1

## 2015-08-03 MED ORDER — POTASSIUM CHLORIDE 10 MEQ/100ML IV SOLN
10.0000 meq | INTRAVENOUS | Status: AC
  Administered 2015-08-03: 10 meq via INTRAVENOUS
  Filled 2015-08-03 (×2): qty 100

## 2015-08-03 MED ORDER — SODIUM BICARBONATE 8.4 % IV SOLN
INTRAVENOUS | Status: DC
  Administered 2015-08-03 – 2015-08-04 (×2): via INTRAVENOUS
  Filled 2015-08-03 (×6): qty 1000

## 2015-08-03 MED ORDER — BUTAMBEN-TETRACAINE-BENZOCAINE 2-2-14 % EX AERO
INHALATION_SPRAY | CUTANEOUS | Status: DC | PRN
Start: 2015-08-03 — End: 2015-08-03
  Administered 2015-08-03: 2 via TOPICAL

## 2015-08-03 MED ORDER — POTASSIUM CHLORIDE 10 MEQ/100ML IV SOLN
10.0000 meq | INTRAVENOUS | Status: AC
  Administered 2015-08-03 (×3): 10 meq via INTRAVENOUS

## 2015-08-03 MED ORDER — POTASSIUM CHLORIDE CRYS ER 20 MEQ PO TBCR
40.0000 meq | EXTENDED_RELEASE_TABLET | Freq: Once | ORAL | Status: DC
Start: 2015-08-03 — End: 2015-08-03

## 2015-08-03 MED ORDER — MIDAZOLAM HCL 5 MG/5ML IJ SOLN
INTRAMUSCULAR | Status: AC
  Filled 2015-08-03: qty 10

## 2015-08-03 MED ORDER — STERILE WATER FOR INJECTION IV SOLN
INTRAVENOUS | Status: DC
  Filled 2015-08-03 (×4): qty 850

## 2015-08-03 MED ORDER — LEVOTHYROXINE SODIUM 100 MCG IV SOLR
50.0000 ug | Freq: Every day | INTRAVENOUS | Status: DC
  Administered 2015-08-03: 50 ug via INTRAVENOUS
  Filled 2015-08-03: qty 5

## 2015-08-03 MED ORDER — STERILE WATER FOR IRRIGATION IR SOLN
Status: DC | PRN
  Administered 2015-08-03: 13:00:00

## 2015-08-03 MED ORDER — MEPERIDINE HCL 50 MG/ML IJ SOLN
INTRAMUSCULAR | Status: DC | PRN
  Administered 2015-08-03 (×2): 25 mg via INTRAVENOUS

## 2015-08-03 NOTE — Progress Notes (Signed)
Patient remains with nausea. She states she had emesis early this morning. She noted scant amount of blood on the tissue when she feels may be coming from her nose. She denies abdominal pain. Patient takes ibuprofen OTC couple of times a week. She also states she had 2 episodes of dysphagia/food impaction relieved spontaneously. Abdominal exam is within normal limits. Patient is to undergo EGD later today. Will plan esophageal dilation if indicated.

## 2015-08-03 NOTE — Op Note (Signed)
[facility] Patient Name: Desiree Flores Procedure Date: 08/03/2015 1:17 PM MRN: [id number] Date of Birth: [birth year] Attending MD: Hildred Laser , MD CSN: [id number] Age: 70 Admit Type: Inpatient Procedure:                Upper GI endoscopy with esophageal dilation Indications:              Esophageal dysphagia Providers:                Hildred Laser, MD, Gwenlyn Fudge, RN, Georgeann Oppenheim,                            Technician Referring MD:             Irine Seal, MD Medicines:                Cetacaine spray, Meperidine 50 mg IV, Midazolam 6                            mg IV Complications:            No immediate complications. Estimated Blood Loss:     Estimated blood loss was minimal. Estimated blood                            loss was minimal. Procedure:                Pre-Anesthesia Assessment:                           - Prior to the procedure, a History and Physical                            was performed, and patient medications and                            allergies were reviewed. The patient's tolerance of                            previous anesthesia was also reviewed. The risks                            and benefits of the procedure and the sedation                            options and risks were discussed with the patient.                            All questions were answered, and informed consent                            was obtained. Prior Anticoagulants: The patient                            last took ibuprofen 7 days prior to the procedure.  ASA Grade Assessment: I - A normal, healthy                            patient. After reviewing the risks and benefits,                            the patient was deemed in satisfactory condition to                            undergo the procedure.                           After obtaining informed consent, the endoscope was                            passed under direct vision.  Throughout the                            procedure, the patient's blood pressure, pulse, and                            oxygen saturations were monitored continuously. The                            [id number] scope was introduced through the                            mouth, and advanced to the second part of duodenum.                            After obtaining informed consent, the endoscope was                            passed under direct vision. Throughout the                            procedure, the patient's blood pressure, pulse, and                            oxygen saturations were monitored continuously. The                            upper GI endoscopy was accomplished without                            difficulty. The patient tolerated the procedure                            well. Scope In: 1:22:28 PM Scope Out: 1:40:40 PM Total Procedure Duration: 0 hours 18 minutes 12 seconds  Findings:      Diffuse mild mucosal changes characterized by granularity and altered       texture were found in the entire esophagus. Biopsies were taken with a       cold forceps for histology.  A low-grade of narrowing Schatzki ring (acquired) was found at the       gastroesophageal junction.      The scope was withdrawn. Dilation was performed in the entire esophagus       with a Maloney dilator with mild resistance at 84 Fr. mucosal disruption       also noted at cervical esophagus indicative of a web.      A 2 cm hiatal hernia was present.      Diffuse granular mucosa was found in the gastric body. Biopsies were       taken with a cold forceps for histology.      The cardia, gastric fundus, gastric antrum and pylorus were normal.      The duodenal bulb and second portion of the duodenum were normal.      The hypopharynx was normal. Impression:               - Granular, texture changed mucosa in the                            esophagus. Biopsied.                           -  Low-grade of narrowing Schatzki ring.                           - Granular mucosa noted at gastric body. Biopsies                            taken for routine histology.                           - 2 cm hiatal hernia. Moderate Sedation:      Moderate (conscious) sedation was administered by the endoscopy nurse       and supervised by the endoscopist. The following parameters were       monitored: oxygen saturation, heart rate, blood pressure, CO2       capnography and response to care. Total physician intraservice time was       18 minutes. Recommendation:           - Return patient to hospital ward for ongoing care.                           - Mechanical soft diet today.                           - No aspirin, ibuprofen, naproxen, or other                            non-steroidal anti-inflammatory drugs for 4 days                            after biopsy.                           - Await pathology results. Procedure Code(s):        --- Professional ---  [id number], Moderate sedation services provided by the                            same physician or other qualified health care                            professional performing the diagnostic or                            therapeutic service that the sedation supports,                            requiring the presence of an independent trained                            observer to assist in the monitoring of the                            patient's level of consciousness and physiological                            status; initial 15 minutes of intraservice time,                            patient age 89 years or older Diagnosis Code(s):        --- Professional ---                           K22.8, Other specified diseases of esophagus                           K22.2, Esophageal obstruction                           K44.9, Diaphragmatic hernia without obstruction or                            gangrene                            R13.14, Dysphagia, pharyngoesophageal phase CPT copyright 2016 American Medical Association. All rights reserved. The codes documented in this report are preliminary and upon coder review may  be revised to meet current compliance requirements. Hildred Laser, MD Hildred Laser, MD 08/03/2015 2:06:36 PM This report has been signed electronically. Number of Addenda: 0

## 2015-08-03 NOTE — Consult Note (Signed)
   [facility] Inpatient Consult   08/03/2015  Desiree Flores [birth year] [id number]  Patient screened for potential [place] Management services. Patient is eligible for [place]. Epic reveals there are no identifiable [facility] care management needs at this time. [facility] Care Management services would not be appropriate at this time.  If patient's post hospital needs change please place a [facility] Care Management consult.   For questions please contact:   Royetta Crochet. Laymond Purser, RN, BSN, [place] [phone]) Business Cell  [phone]) Toll Free Office

## 2015-08-03 NOTE — Progress Notes (Signed)
[facility] PROGRESS NOTE  Desiree Flores [id number] DOB: 11-22-1945 DOA: 07/31/2015 PCP: Gar Ponto, MD  Assessment/Plan: #1 emesis/dysphagia and epigastric pain likely secondary to Schatzki's ring, duodenitis and possible acute pancreatitis versus duodenitis Patient admitted with emesis and epigastric pain CT abdomen and pelvis concerning for acute pancreatitis versus duodenitis. Abdominal ultrasound negative for cholelithiasis and unremarkable. Lipase levels were elevated at 85 and trending down and currently at 47. LFTs trending down. Patient currently afebrile. Leukocytosis trending down.  Patient status post upper endoscopy 08/03/2015 per Dr. Laural Golden which showed granular texture changed mucosa in the esophagus status post biopsy, low-grade narrowing Schatzki's ring status post dilatation, granular mucosa noted at the gastric body, 2 cm hiatus show hernia. Clinical improvement. Patient's diet has been advanced to a soft diet. Continue hydration. Continue PPI. Will discontinue antibiotics. GI following and appreciate input and recommendations.  #2 transaminitis/elevated LFTs Likely reactive secondary to pancreatitis versus duodenitis. LFTs trending down. Abdominal ultrasound unremarkable. Follow.  #3 gastroesophageal reflux disease PPI.  #4 hypothyroidism Continue Synthroid.  #5 prophylaxis   PPI for GI prophylaxis. Lovenox for DVT prophylaxis.  Code Status: Full Family Communication: Updated patient and husband at bedside. Disposition Plan: Home when abdominal pain has resolved patient tolerating oral intake, hopefully in 1-2 days.   Consultants:  Gastroenterology: Dr. Laural Golden 08/02/2015  Antibiotics:  IV ciprofloxacin 07/31/2015>>>>> 08/03/2015  IV Flagyl 07/31/2015>>>>>> 08/03/2015  : procedures   CT abdomen and pelvis 07/31/2015  Chest x-ray 07/30/2015  Abdominal ultrasound 07/31/2015  Upper endoscopy 08/03/2015 per  Dr.Rehman  HPI/Subjective: Patient states some improvement with abdominal pain. Patient complaining of a sore throat post EGD. No further nausea. No further emesis.  Objective: Filed Vitals:   08/03/15 1345 08/03/15 1407  BP: 119/56 119/71  Pulse: 70 68  Temp:  98.3 F (36.8 C)  Resp: 11 16    Intake/Output Summary (Last 24 hours) at 08/03/15 1510 Last data filed at 08/03/15 1355  Gross per 24 hour  Intake    850 ml  Output   1250 ml  Net   -400 ml   Filed Weights   07/31/15 0849 07/31/15 0940  Weight: 68.04 kg (150 lb) 68.04 kg (150 lb)    Exam:   General:  NAD  Cardiovascular: RRR  Respiratory: CTAB  Abdomen: Soft/ND/NTTP/+BS  Musculoskeletal: No c/c/e  Data Reviewed: Basic Metabolic Panel:  Recent Labs Lab 07/30/15 0922 07/31/15 0322 08/01/15 0637 08/02/15 0548 08/03/15 0514  NA 141 139 138 143 140  K 3.7 3.7 3.5 3.6 3.4*  CL 104 105 109 115* 112*  CO2 27 26 23  21* 19*  GLUCOSE 118* 138* 101* 94 90  BUN 22* 18 11 11 13   CREATININE 0.91 0.84 0.84 0.81 0.85  CALCIUM 10.3 9.1 8.0* 8.3* 8.2*   Liver Function Tests:  Recent Labs Lab 07/30/15 0930 07/31/15 0322 08/01/15 0637 08/02/15 0548 08/03/15 0514  AST 32 127* 38 26 24  ALT 53 124* 73* 55* 46  ALKPHOS 68 70 54 54 51  BILITOT 0.7 1.2 0.8 0.6 0.8  PROT 7.2 7.7 6.0* 6.0* 6.1*  ALBUMIN 4.3 4.1 3.1* 3.0* 3.0*    Recent Labs Lab 07/30/15 0930 07/31/15 0322 08/01/15 0637 08/02/15 0548 08/03/15 0514  LIPASE 49 85* 60* 45 47   No results for input(s): AMMONIA in the last 168 hours. CBC:  Recent Labs Lab 07/30/15 0922 07/31/15 0322 08/01/15 0637 08/02/15 0548 08/03/15 0514  WBC 9.5 13.1* 7.9 6.3 5.6  NEUTROABS 7.0 11.2*  --   --   --  HGB 15.0 14.8 12.2 12.2 12.1  HCT 44.7 43.0 36.8 36.2 36.4  MCV 88.7 88.5 89.8 88.9 87.5  PLT 162 150 134* 138* 144*   Cardiac Enzymes:  Recent Labs Lab 07/30/15 0922 07/31/15 0322  TROPONINI <0.03 <0.03   BNP (last 3 results) No  results for input(s): BNP in the last 8760 hours.  ProBNP (last 3 results) No results for input(s): PROBNP in the last 8760 hours.  CBG: No results for input(s): GLUCAP in the last 168 hours.  No results found for this or any previous visit (from the past 240 hour(s)).   Studies: No results found.  Scheduled Meds: . ciprofloxacin  400 mg Intravenous Q12H  . [START ON 08/04/2015] levothyroxine  50 mcg Intravenous QAC breakfast  . meperidine      . metoCLOPramide (REGLAN) injection  10 mg Intravenous 4 times per day  . metronidazole  500 mg Intravenous Q8H  . midazolam      . pantoprazole (PROTONIX) IV  40 mg Intravenous Q12H  . potassium chloride  10 mEq Intravenous Q2H   Continuous Infusions: . sodium chloride 1,000 mL (08/03/15 1234)  . dextrose 5 % 1,000 mL with potassium chloride 40 mEq, sodium bicarbonate 150 mEq infusion 125 mL/hr at 08/03/15 1014    Principal Problem:   Epigastric abdominal pain Active Problems:   Pancreatitis   Dysphagia   Schatzki's ring: s/p dilatation per EGD 08/03/2015 Per Dr Laural Golden   Diverticulitis   GERD (gastroesophageal reflux disease)   Hypothyroidism   Diverticulitis, duodenum   Pancreatitis, acute   Duodenitis    Time spent: [place] MD [facility] Pager [phone]. If 7PM-7AM, please contact night-coverage at [email or web address], password [facility] 08/03/2015, 3:10 PM  LOS: 3 days

## 2015-08-04 DIAGNOSIS — K2980 Duodenitis without bleeding: Secondary | ICD-10-CM

## 2015-08-04 DIAGNOSIS — R1013 Epigastric pain: Secondary | ICD-10-CM

## 2015-08-04 LAB — COMPREHENSIVE METABOLIC PANEL
ALBUMIN: 2.9 g/dL — AB (ref 3.5–5.0)
ALT: 46 U/L (ref 14–54)
ANION GAP: 7 (ref 5–15)
AST: 37 U/L (ref 15–41)
Alkaline Phosphatase: 48 U/L (ref 38–126)
BUN: 8 mg/dL (ref 6–20)
CHLORIDE: 109 mmol/L (ref 101–111)
CO2: 24 mmol/L (ref 22–32)
Calcium: 8.1 mg/dL — ABNORMAL LOW (ref 8.9–10.3)
Creatinine, Ser: 0.85 mg/dL (ref 0.44–1.00)
GFR calc Af Amer: >60 mL/min (ref >60)
Glucose, Bld: 109 mg/dL — ABNORMAL HIGH (ref 65–99)
POTASSIUM: 3.7 mmol/L (ref 3.5–5.1)
Sodium: 140 mmol/L (ref 135–145)
TOTAL PROTEIN: 5.9 g/dL — AB (ref 6.5–8.1)
Total Bilirubin: 0.6 mg/dL (ref 0.3–1.2)

## 2015-08-04 LAB — CBC
HCT: 37.2 % (ref 36.0–46.0)
HEMOGLOBIN: 13 g/dL (ref 12.0–15.0)
MCH: 30 pg (ref 26.0–34.0)
MCHC: 34.9 g/dL (ref 30.0–36.0)
MCV: 85.9 fL (ref 78.0–100.0)
Platelets: 160 10*3/uL (ref 150–400)
RBC: 4.33 MIL/uL (ref 3.87–5.11)
RDW: 13 % (ref 11.5–15.5)
WBC: 5.9 10*3/uL (ref 4.0–10.5)

## 2015-08-04 MED ORDER — LEVOTHYROXINE SODIUM 100 MCG PO TABS
100.0000 ug | ORAL_TABLET | Freq: Every day | ORAL | Status: DC
  Administered 2015-08-04: 100 ug via ORAL
  Filled 2015-08-04: qty 1

## 2015-08-04 MED ORDER — PANTOPRAZOLE SODIUM 40 MG PO TBEC
40.0000 mg | DELAYED_RELEASE_TABLET | Freq: Two times a day (BID) | ORAL | Status: DC
  Administered 2015-08-04: 40 mg via ORAL
  Filled 2015-08-04: qty 1

## 2015-08-04 MED ORDER — PANTOPRAZOLE SODIUM 40 MG PO TBEC: 40.0000 mg | DELAYED_RELEASE_TABLET | Freq: Every day | ORAL | Status: DC

## 2015-08-04 NOTE — Plan of Care (Signed)
Report received. Assume care of pt. Desiree Flores SN [facility]

## 2015-08-04 NOTE — Discharge Summary (Signed)
Physician Discharge Summary  DAWNN RIGGAN [id number] DOB: 03/19/1946 DOA: 07/31/2015  PCP: Gar Ponto, MD  Admit date: 07/31/2015 Discharge date: 08/04/2015  Time spent: 35 minutes  Recommendations for Outpatient Follow-up:  Follow up biopsy results.   Discharge Diagnoses:    Epigastric abdominal pain   Diverticulitis   Pancreatitis   GERD (gastroesophageal reflux disease)   Hypothyroidism   Diverticulitis, duodenum   Pancreatitis, acute   Duodenitis   Dysphagia   Schatzki's ring: s/p dilatation per EGD 08/03/2015 Per Dr Laural Golden   Discharge Condition: Stable  Diet recommendation: heart healthy   Filed Weights   07/31/15 0849 07/31/15 0940  Weight: 68.04 kg (150 lb) 68.04 kg (150 lb)    History of present illness:  HPI: Desiree Flores is a 70 y.o. female with a hx of diverticulitis and hypothyroidism who presents with complaints of emesis and epigastric abd pain. Pt initially presented to the RE yesterday with complaints of substernal chest discomfort. She had cardiac workup done that was found to be unremarkable. And she was subsequently discharged home. Prior to leaving the ER she had two episodes of emesis, which resolved prior to discharge. After returning home, she developed worsening epigastric discomfort and had several episodes of emesis. When her symptoms did not resolve, she came back to the ER for eval. She denies any fever, diarrhea. SOB, cough, or smoking. She has not had any sick contacts. She did eat [employer] prior to the onset of her symptoms.  While in the ED, workup revealed lipase 85, AST 127, ALT 124. Her troponin were wnl. WBC 13.1. [place] abd showed her RUQ as unremarkable. A CT abd revealed mild inflammatory changes of the second portion of th duodenum, with findings likely representing duodenitis.   Hospital Course:  #1 emesis/dysphagia and epigastric pain likely secondary to Schatzki's ring, duodenitis and possible acute pancreatitis versus  duodenitis Patient admitted with emesis and epigastric pain CT abdomen and pelvis concerning for acute pancreatitis versus duodenitis. Abdominal ultrasound negative for cholelithiasis and unremarkable. Lipase levels were elevated at 85 and trending down and currently at 47. LFTs trending down. Patient currently afebrile. Leukocytosis trending down.  Patient status post upper endoscopy 08/03/2015 per Dr. Laural Golden which showed granular texture changed mucosa in the esophagus status post biopsy, low-grade narrowing Schatzki's ring status post dilatation, granular mucosa noted at the gastric body, 2 cm hiatus show hernia. Clinical improvement. Patient's diet has been advanced to a soft diet. Continue hydration. Continue PPI . discontinue antibiotics. GI following and appreciate input and recommendations. Discharge on protonix daily Tolerated diet today   #2 transaminitis/elevated LFTs Likely reactive secondary to pancreatitis versus duodenitis. LFTs trending down. Abdominal ultrasound unremarkable. Follow. Resolved.   #3 gastroesophageal reflux disease PPI.  #4 hypothyroidism Continue Synthroid.  #5 prophylaxis  PPI for GI prophylaxis. Lovenox for DVT prophylaxis.  Procedures:  Endoscopy   Consultations:  GI  Discharge Exam: Filed Vitals:   08/04/15 0556 08/04/15 0740  BP: 151/73 133/67  Pulse: 71 66  Temp: 99.3 F (37.4 C) 98.3 F (36.8 C)  Resp: 18 17    General: NAD Cardiovascular: S 1, S 2 RRR Respiratory: CTA  Discharge Instructions   Discharge Instructions    Diet - low sodium heart healthy    Complete by:  As directed      Increase activity slowly    Complete by:  As directed           Current Discharge Medication List    START taking  these medications   Details  pantoprazole (PROTONIX) 40 MG tablet Take 1 tablet (40 mg total) by mouth daily. Qty: 30 tablet, Refills: 0      CONTINUE these medications which have NOT CHANGED   Details  Calcium  Carbonate-Vit D-Min (CALCIUM 600+D3 PLUS MINERALS) 600-800 MG-UNIT TABS Take 1 capsule by mouth daily.    glucosamine-chondroitin 500-400 MG tablet Take 2 tablets by mouth daily.     levothyroxine (SYNTHROID, LEVOTHROID) 100 MCG tablet Take 100 mcg by mouth daily before breakfast.    Multiple Vitamins-Minerals ([employer] SPECTRAVITE ADVANCED PO) Take 1 tablet by mouth daily.     ondansetron (ZOFRAN ODT) 8 MG disintegrating tablet Take 1 tablet (8 mg total) by mouth every 8 (eight) hours as needed for nausea or vomiting. Qty: 12 tablet, Refills: 0      STOP taking these medications     ibuprofen (ADVIL,MOTRIN) 200 MG tablet      omeprazole (PRILOSEC) 20 MG capsule        Allergies  Allergen Reactions  . Sulfa Antibiotics    Follow-up Information    Follow up with REHMAN,NAJEEB U, MD In 2 weeks.   Specialty:  Gastroenterology   Contact information:   [place], [address] [place] [address] [phone]        The results of significant diagnostics from this hospitalization (including imaging, microbiology, ancillary and laboratory) are listed below for reference.    Significant Diagnostic Studies: Dg Chest 2 View  07/30/2015  CLINICAL DATA:  Chest pain for 1 day EXAM: CHEST  2 VIEW COMPARISON:  None. FINDINGS: Lungs are clear. The heart size and pulmonary vascularity are normal. No adenopathy. No pneumothorax. No bone lesions. IMPRESSION: No edema or consolidation. Electronically Signed   By: Lowella Grip III M.D.   On: 07/30/2015 09:33   Ct Abdomen Pelvis W Contrast  07/31/2015  CLINICAL DATA:  70 year old female with abdominal pain and elevated liver enzymes. EXAM: CT ABDOMEN AND PELVIS WITH CONTRAST TECHNIQUE: Multidetector CT imaging of the abdomen and pelvis was performed using the standard protocol following bolus administration of intravenous contrast. CONTRAST:  10mL OMNIPAQUE IOHEXOL 300 MG/ML  SOLN COMPARISON:  None. FINDINGS: The visualized lung bases are  clear. No intra-abdominal free air or free fluid. Apparent mild diffuse hepatic steatosis. The gallbladder is mildly distended. No calcified gallstone identified. The pancreas, spleen, adrenal glands, kidneys, visualized ureters and urinary bladder appear unremarkable. The uterus is anteverted. Multiple small exophytic/subserosal fibroids noted arising from the uterine fundus. The visualized ovaries are grossly unremarkable. A 2 cm diverticulum is noted arising from the medial wall of the second portion of the duodenum. There is mild haziness of the fat surrounding the second portion of the duodenum with mild apparent thickening of the duodenal wall. Findings concerning for duodenitis and less likely diverticulitis of the duodenum. Mild inflammatory changes are primarily adjacent to the duodenum with less involvement of the head of the pancreas. However, in the presence of elevated lipase, primary pancreatitis with secondary inflammatory changes of the duodenum is not excluded. Clinical correlation recommended. There is sigmoid diverticulosis without active inflammatory changes. There is no evidence of bowel obstruction. Normal appendix. The abdominal aorta and IVC appear unremarkable. No portal venous gas identified. There is no adenopathy. The abdominal wall soft tissues appear unremarkable. The osseous structures are intact. IMPRESSION: Mild inflammatory changes of the second portion of the duodenum. A 2 cm diverticulum is noted arising from the medial aspect of the second portion of  the duodenum. Findings likely represent duodenitis and less likely diverticulitis of the duodenum. Acute pancreatitis with secondary inflammatory changes of the duodenum is not excluded in the presence of elevated pancreatic enzymes. Correlation with clinical exam recommended. Electronically Signed   By: Anner Crete M.D.   On: 07/31/2015 06:53   US Abdomen Limited Ruq  07/31/2015  CLINICAL DATA:  Epigastric pain, elevated  LFTs, possible gallstones EXAM: US ABDOMEN LIMITED - RIGHT UPPER QUADRANT COMPARISON:  CT scan 07/31/2015 FINDINGS: Gallbladder: No gallstones or wall thickening visualized. No sonographic Murphy sign noted by sonographer. Common bile duct: Diameter: 4.3 mm in diameter within normal limits. Liver: No focal lesion identified. Within normal limits in parenchymal echogenicity. IMPRESSION: Unremarkable right upper quadrant ultrasound. Electronically Signed   By: Lahoma Crocker M.D.   On: 07/31/2015 11:02    Microbiology: No results found for this or any previous visit (from the past 240 hour(s)).   Labs: Basic Metabolic Panel:  Recent Labs Lab 07/31/15 0322 08/01/15 0637 08/02/15 0548 08/03/15 0514 08/04/15 0705  NA 139 138 143 140 140  K 3.7 3.5 3.6 3.4* 3.7  CL 105 109 115* 112* 109  CO2 26 23 21* 19* 24  GLUCOSE 138* 101* 94 90 109*  BUN 18 11 11 13 8   CREATININE 0.84 0.84 0.81 0.85 0.85  CALCIUM 9.1 8.0* 8.3* 8.2* 8.1*   Liver Function Tests:  Recent Labs Lab 07/31/15 0322 08/01/15 0637 08/02/15 0548 08/03/15 0514 08/04/15 0705  AST 127* 38 26 24 37  ALT 124* 73* 55* 46 46  ALKPHOS 70 54 54 51 48  BILITOT 1.2 0.8 0.6 0.8 0.6  PROT 7.7 6.0* 6.0* 6.1* 5.9*  ALBUMIN 4.1 3.1* 3.0* 3.0* 2.9*    Recent Labs Lab 07/30/15 0930 07/31/15 0322 08/01/15 0637 08/02/15 0548 08/03/15 0514  LIPASE 49 85* 60* 45 47   No results for input(s): AMMONIA in the last 168 hours. CBC:  Recent Labs Lab 07/30/15 0922 07/31/15 0322 08/01/15 [id number] 08/02/15 0548 08/03/15 0514 08/04/15 0705  WBC 9.5 13.1* 7.9 6.3 5.6 5.9  NEUTROABS 7.0 11.2*  --   --   --   --   HGB 15.0 14.8 12.2 12.2 12.1 13.0  HCT 44.7 43.0 36.8 36.2 36.4 37.2  MCV 88.7 88.5 89.8 88.9 87.5 85.9  PLT 162 150 134* 138* 144* 160   Cardiac Enzymes:  Recent Labs Lab 07/30/15 0922 07/31/15 0322  TROPONINI <0.03 <0.03   BNP: BNP (last 3 results) No results for input(s): BNP in the last 8760 hours.  ProBNP  (last 3 results) No results for input(s): PROBNP in the last 8760 hours.  CBG: No results for input(s): GLUCAP in the last 168 hours.     Signed:  Elmarie Shiley MD.  [facility] 08/04/2015, 10:58 AM

## 2015-08-04 NOTE — Care Management Important Message (Signed)
Important Message  Patient Details  Name: MICAYAH PREWITT MRN: [id number] Date of Birth: October 07, 1945   Medicare Important Message Given:  Yes    Alvie Heidelberg, RN 08/04/2015, 11:21 AM

## 2015-08-04 NOTE — Care Management Note (Signed)
Case Management Note  Patient Details  Name: TAIDE TAFUR MRN: [id number] Date of Birth: 1945/06/21  Subjective/Objective:         Spoke with patient who is alert and oriented from home. Patient states that they do not use any DME and have no diffculties ambulating or attending appointments. Patient states that they are able to afford home medications. No CM needs identified.           Action/Plan: Home with Self care.   Expected Discharge Date:  08/02/15               Expected Discharge Plan:  Home/Self Care  In-House Referral:     Discharge planning Services  CM Consult  Post Acute Care Choice:    Choice offered to:     DME Arranged:    DME Agency:     HH Arranged:    HH Agency:     Status of Service:  Completed, signed off  Medicare Important Message Given:  Yes Date Medicare IM Given:    Medicare IM give by:    Date Additional Medicare IM Given:    Additional Medicare Important Message give by:     If discussed at [place] of Stay Meetings, dates discussed:    Additional Comments:  Alvie Heidelberg, RN 08/04/2015, 11:39 AM

## 2015-08-06 ENCOUNTER — Encounter (HOSPITAL_COMMUNITY): Payer: Self-pay | Admitting: Internal Medicine

## 2015-08-09 DIAGNOSIS — R1011 Right upper quadrant pain: Secondary | ICD-10-CM | POA: Diagnosis not present

## 2015-08-09 DIAGNOSIS — K851 Biliary acute pancreatitis without necrosis or infection: Secondary | ICD-10-CM | POA: Diagnosis not present

## 2015-08-09 DIAGNOSIS — K219 Gastro-esophageal reflux disease without esophagitis: Secondary | ICD-10-CM | POA: Diagnosis not present

## 2015-08-18 ENCOUNTER — Ambulatory Visit (INDEPENDENT_AMBULATORY_CARE_PROVIDER_SITE_OTHER): Payer: Medicare Other | Admitting: Internal Medicine

## 2015-08-18 ENCOUNTER — Encounter (INDEPENDENT_AMBULATORY_CARE_PROVIDER_SITE_OTHER): Payer: Self-pay | Admitting: Internal Medicine

## 2015-08-18 ENCOUNTER — Encounter (INDEPENDENT_AMBULATORY_CARE_PROVIDER_SITE_OTHER): Payer: Self-pay

## 2015-08-18 VITALS — BP 128/80 | HR 56 | Temp 98.0°F | Ht 67.0 in | Wt 158.4 lb

## 2015-08-18 DIAGNOSIS — R748 Abnormal levels of other serum enzymes: Secondary | ICD-10-CM

## 2015-08-18 DIAGNOSIS — R1310 Dysphagia, unspecified: Secondary | ICD-10-CM

## 2015-08-18 LAB — HEPATIC FUNCTION PANEL
ALBUMIN: 4.3 g/dL (ref 3.6–5.1)
ALK PHOS: 61 U/L (ref 33–130)
ALT: 54 U/L — AB (ref 6–29)
AST: 31 U/L (ref 10–35)
Bilirubin, Direct: 0.1 mg/dL (ref <=0.2)
Indirect Bilirubin: 0.4 mg/dL (ref 0.2–1.2)
TOTAL PROTEIN: 7.3 g/dL (ref 6.1–8.1)
Total Bilirubin: 0.5 mg/dL (ref 0.2–1.2)

## 2015-08-18 NOTE — Patient Instructions (Signed)
Continue the Protnix. OV in 3 months.

## 2015-08-18 NOTE — Progress Notes (Signed)
   Subjective:    Patient ID: Desiree Flores, female    DOB: 1945-06-06, 70 y.o.   MRN: [id number]  HPIHere today for f/u after recent admission to [facility] for epigastric pain/chest pain. Cardiac orgin ruled out. She underwent an EGD (see below). She says she is not having any problems swallowing. She can eat anything she wants. She tells me feels good. She says she feels 100% better.   08/09/2015 ALP 67, AST 57, ALT 85  08/03/2015 EGD/ED:dysphagia Diffuse mild mucosal changes characterized by granularity and altered texture were found in the entire esophagus. A low grade Schatzki (acquired) was found at GE junction.  A 2cm hiatal hernia was present.  Biopsy: chronic gastritis. Negative for h. pylori. No intestinal metaplasia, dysplasia, or malignancy.  07/31/2014 CT Abdomen/pelvis with CM: abdominal pain: IMPRESSION: Mild inflammatory changes of the second portion of the duodenum. A 2 cm diverticulum is noted arising from the medial aspect of the second portion of the duodenum. Findings likely represent duodenitis and less likely diverticulitis of the duodenum. Acute pancreatitis with secondary inflammatory changes of the duodenum is not excluded in the presence of elevated pancreatic enzymes. Correlation with clinical exam recommended.  07/31/2015 US abdomen:        CLINICAL DATA:  Epigastric pain, elevated LFTs, possible gallstones  MPRESSION: Unremarkable right upper quadrant ultrasound.    CMP     Component Value Date/Time   NA 140 08/04/2015 0705   K 3.7 08/04/2015 0705   CL 109 08/04/2015 0705   CO2 24 08/04/2015 0705   GLUCOSE 109* 08/04/2015 0705   BUN 8 08/04/2015 0705   CREATININE 0.85 08/04/2015 0705   CALCIUM 8.1* 08/04/2015 0705   PROT 5.9* 08/04/2015 0705   ALBUMIN 2.9* 08/04/2015 0705   AST 37 08/04/2015 0705   ALT 46 08/04/2015 0705   ALKPHOS 48 08/04/2015 0705   BILITOT 0.6 08/04/2015 0705   GFRNONAA >60 08/04/2015 0705   GFRAA >60 08/04/2015 0705       01/28/2014 Colonoscopy  Indications: Patient is 70 year old Caucasian female who is here for diagnostic colonoscopy. About two months ago she was treated for sigmoid diverticulitis. She is now symptom free. Last colonoscopy was in 2006. Family history is negative for CRC to  Impression:  Examination performed to cecum. Sigmoid colon diverticulosis otherwise normal colonoscopy  Review of Systems    Objective:   Physical Exam Blood pressure 128/80, pulse 56, temperature 98 F (36.7 C), height 5\' 7"  (1.702 m), weight 158 lb 6.4 oz (71.85 kg). Alert and oriented. Skin warm and dry. Oral mucosa is moist.   . Sclera anicteric, conjunctivae is pink. Thyroid not enlarged. No cervical lymphadenopathy. Lungs clear. Heart regular rate and rhythm.  Abdomen is soft. Bowel sounds are positive. No hepatomegaly. No abdominal masses felt. No tenderness.  No edema to lower extremities.          Assessment & Plan:  Dysphagia. She is 100% better.  Elevated liver enzymes: am going to repeat an Hepatic function. OV in 3 months.

## 2015-08-19 DIAGNOSIS — E782 Mixed hyperlipidemia: Secondary | ICD-10-CM | POA: Diagnosis not present

## 2015-08-19 DIAGNOSIS — K219 Gastro-esophageal reflux disease without esophagitis: Secondary | ICD-10-CM | POA: Diagnosis not present

## 2015-08-19 DIAGNOSIS — K7581 Nonalcoholic steatohepatitis (NASH): Secondary | ICD-10-CM | POA: Diagnosis not present

## 2015-08-19 DIAGNOSIS — K760 Fatty (change of) liver, not elsewhere classified: Secondary | ICD-10-CM | POA: Diagnosis not present

## 2015-08-26 DIAGNOSIS — E782 Mixed hyperlipidemia: Secondary | ICD-10-CM | POA: Diagnosis not present

## 2015-08-26 DIAGNOSIS — K760 Fatty (change of) liver, not elsewhere classified: Secondary | ICD-10-CM | POA: Diagnosis not present

## 2015-09-01 ENCOUNTER — Other Ambulatory Visit (INDEPENDENT_AMBULATORY_CARE_PROVIDER_SITE_OTHER): Payer: Self-pay | Admitting: *Deleted

## 2015-09-01 ENCOUNTER — Telehealth (INDEPENDENT_AMBULATORY_CARE_PROVIDER_SITE_OTHER): Payer: Self-pay | Admitting: *Deleted

## 2015-09-01 ENCOUNTER — Encounter (INDEPENDENT_AMBULATORY_CARE_PROVIDER_SITE_OTHER): Payer: Self-pay | Admitting: *Deleted

## 2015-09-01 DIAGNOSIS — R748 Abnormal levels of other serum enzymes: Secondary | ICD-10-CM

## 2015-09-01 NOTE — Telephone Encounter (Signed)
.  Per Lelon Perla patient to have labs in 2 weeks Sep 14 2015.

## 2015-09-03 DIAGNOSIS — K851 Biliary acute pancreatitis without necrosis or infection: Secondary | ICD-10-CM | POA: Diagnosis not present

## 2015-09-03 DIAGNOSIS — K219 Gastro-esophageal reflux disease without esophagitis: Secondary | ICD-10-CM | POA: Diagnosis not present

## 2015-09-03 DIAGNOSIS — R1011 Right upper quadrant pain: Secondary | ICD-10-CM | POA: Diagnosis not present

## 2015-09-08 DIAGNOSIS — E782 Mixed hyperlipidemia: Secondary | ICD-10-CM | POA: Diagnosis not present

## 2015-09-08 DIAGNOSIS — K219 Gastro-esophageal reflux disease without esophagitis: Secondary | ICD-10-CM | POA: Diagnosis not present

## 2015-09-08 DIAGNOSIS — E039 Hypothyroidism, unspecified: Secondary | ICD-10-CM | POA: Diagnosis not present

## 2015-09-14 DIAGNOSIS — R748 Abnormal levels of other serum enzymes: Secondary | ICD-10-CM | POA: Diagnosis not present

## 2015-09-15 LAB — HEPATIC FUNCTION PANEL
ALBUMIN: 4.5 g/dL (ref 3.6–5.1)
ALK PHOS: 74 U/L (ref 33–130)
ALT: 68 U/L — ABNORMAL HIGH (ref 6–29)
AST: 37 U/L — AB (ref 10–35)
Bilirubin, Direct: 0.1 mg/dL (ref <=0.2)
Indirect Bilirubin: 0.5 mg/dL (ref 0.2–1.2)
TOTAL PROTEIN: 7.6 g/dL (ref 6.1–8.1)
Total Bilirubin: 0.6 mg/dL (ref 0.2–1.2)

## 2015-09-15 LAB — LIPASE: Lipase: 66 U/L — ABNORMAL HIGH (ref 7–60)

## 2015-09-16 ENCOUNTER — Telehealth (INDEPENDENT_AMBULATORY_CARE_PROVIDER_SITE_OTHER): Payer: Self-pay | Admitting: Internal Medicine

## 2015-09-16 DIAGNOSIS — R748 Abnormal levels of other serum enzymes: Secondary | ICD-10-CM

## 2015-09-16 NOTE — Telephone Encounter (Signed)
Patient called and stated that she had bloodwork done early Tuesday morning.  She would like a call back with the results.  [phone]

## 2015-09-16 NOTE — Telephone Encounter (Signed)
Results have been given to patient 

## 2015-09-16 NOTE — Telephone Encounter (Signed)
.  Per Lelon Perla - patient to have Lipase ,Hepatic Function in 4 weeks.

## 2015-09-20 DIAGNOSIS — K5730 Diverticulosis of large intestine without perforation or abscess without bleeding: Secondary | ICD-10-CM | POA: Diagnosis not present

## 2015-09-20 DIAGNOSIS — E782 Mixed hyperlipidemia: Secondary | ICD-10-CM | POA: Diagnosis not present

## 2015-09-20 DIAGNOSIS — Z9189 Other specified personal risk factors, not elsewhere classified: Secondary | ICD-10-CM | POA: Diagnosis not present

## 2015-09-20 DIAGNOSIS — M20011 Mallet finger of right finger(s): Secondary | ICD-10-CM | POA: Diagnosis not present

## 2015-09-20 DIAGNOSIS — E039 Hypothyroidism, unspecified: Secondary | ICD-10-CM | POA: Diagnosis not present

## 2015-09-23 ENCOUNTER — Encounter (INDEPENDENT_AMBULATORY_CARE_PROVIDER_SITE_OTHER): Payer: Self-pay | Admitting: *Deleted

## 2015-09-23 ENCOUNTER — Other Ambulatory Visit (INDEPENDENT_AMBULATORY_CARE_PROVIDER_SITE_OTHER): Payer: Self-pay | Admitting: *Deleted

## 2015-09-23 DIAGNOSIS — R748 Abnormal levels of other serum enzymes: Secondary | ICD-10-CM

## 2015-10-13 DIAGNOSIS — R748 Abnormal levels of other serum enzymes: Secondary | ICD-10-CM | POA: Diagnosis not present

## 2015-10-14 LAB — HEPATIC FUNCTION PANEL
ALK PHOS: 60 U/L (ref 33–130)
ALT: 51 U/L — AB (ref 6–29)
AST: 31 U/L (ref 10–35)
Albumin: 4.2 g/dL (ref 3.6–5.1)
BILIRUBIN DIRECT: 0.1 mg/dL (ref <=0.2)
BILIRUBIN INDIRECT: 0.4 mg/dL (ref 0.2–1.2)
BILIRUBIN TOTAL: 0.5 mg/dL (ref 0.2–1.2)
Total Protein: 7.2 g/dL (ref 6.1–8.1)

## 2015-10-14 LAB — LIPASE: Lipase: 52 U/L (ref 7–60)

## 2015-10-20 ENCOUNTER — Telehealth (INDEPENDENT_AMBULATORY_CARE_PROVIDER_SITE_OTHER): Payer: Self-pay | Admitting: Internal Medicine

## 2015-10-20 DIAGNOSIS — R748 Abnormal levels of other serum enzymes: Secondary | ICD-10-CM

## 2015-10-20 DIAGNOSIS — I214 Non-ST elevation (NSTEMI) myocardial infarction: Secondary | ICD-10-CM

## 2015-10-21 NOTE — Telephone Encounter (Signed)
.  Per Lelon Perla patient is to have lab work drawn in 4 weeks.

## 2015-10-28 ENCOUNTER — Other Ambulatory Visit (INDEPENDENT_AMBULATORY_CARE_PROVIDER_SITE_OTHER): Payer: Self-pay | Admitting: *Deleted

## 2015-10-28 ENCOUNTER — Encounter (INDEPENDENT_AMBULATORY_CARE_PROVIDER_SITE_OTHER): Payer: Self-pay | Admitting: *Deleted

## 2015-10-28 DIAGNOSIS — R748 Abnormal levels of other serum enzymes: Secondary | ICD-10-CM

## 2015-11-01 DIAGNOSIS — R5383 Other fatigue: Secondary | ICD-10-CM | POA: Diagnosis not present

## 2015-11-01 DIAGNOSIS — K5730 Diverticulosis of large intestine without perforation or abscess without bleeding: Secondary | ICD-10-CM | POA: Diagnosis not present

## 2015-11-01 DIAGNOSIS — K219 Gastro-esophageal reflux disease without esophagitis: Secondary | ICD-10-CM | POA: Diagnosis not present

## 2015-11-01 DIAGNOSIS — E039 Hypothyroidism, unspecified: Secondary | ICD-10-CM | POA: Diagnosis not present

## 2015-11-01 DIAGNOSIS — E782 Mixed hyperlipidemia: Secondary | ICD-10-CM | POA: Diagnosis not present

## 2015-11-01 DIAGNOSIS — M1711 Unilateral primary osteoarthritis, right knee: Secondary | ICD-10-CM | POA: Diagnosis not present

## 2015-11-19 DIAGNOSIS — R748 Abnormal levels of other serum enzymes: Secondary | ICD-10-CM | POA: Diagnosis not present

## 2015-11-20 LAB — HEPATIC FUNCTION PANEL
ALBUMIN: 4.2 g/dL (ref 3.6–5.1)
ALT: 33 U/L — AB (ref 6–29)
AST: 25 U/L (ref 10–35)
Alkaline Phosphatase: 71 U/L (ref 33–130)
BILIRUBIN INDIRECT: 0.4 mg/dL (ref 0.2–1.2)
Bilirubin, Direct: 0.1 mg/dL (ref <=0.2)
TOTAL PROTEIN: 6.9 g/dL (ref 6.1–8.1)
Total Bilirubin: 0.5 mg/dL (ref 0.2–1.2)

## 2015-11-23 ENCOUNTER — Encounter (INDEPENDENT_AMBULATORY_CARE_PROVIDER_SITE_OTHER): Payer: Self-pay | Admitting: Internal Medicine

## 2015-11-23 ENCOUNTER — Ambulatory Visit (INDEPENDENT_AMBULATORY_CARE_PROVIDER_SITE_OTHER): Payer: Medicare Other | Admitting: Internal Medicine

## 2015-11-23 VITALS — BP 134/72 | HR 64 | Temp 97.4°F | Ht 67.0 in | Wt 149.9 lb

## 2015-11-23 DIAGNOSIS — R748 Abnormal levels of other serum enzymes: Secondary | ICD-10-CM

## 2015-11-23 NOTE — Patient Instructions (Signed)
OV in 6 months. Hepatic function in 4 weeks.

## 2015-11-23 NOTE — Progress Notes (Addendum)
Subjective:    Patient ID: Desiree Flores, female    DOB: 10-Apr-1946, 70 y.o.   MRN: [id number]  HPI Here today for f/u. Recent admission to [facility] for epigastric pain/chest pain. Cardiac origin ruled out.  She underwent an EGD which revealed diffuse mild mucosal changes characterized by granularity and altered texture were found in the entire esophagus. A low grade Schatzki (acquired) was found at GE junction. A 2cm hiatal hernia was present.  Biopsy: chronic gastritis. Negative for h. pylori. No intestinal metaplasia, dysplasia, or malignancy.  Also noted on admission elevated transaminases. Recent transaminases show slightly elevated ALT of 33.  She tells me she is doing fine since discharge.  She feels good. Appetite is good. She has lost 8 pounds which was intentional. No dysphagia.  Her BMs are normal. No melena or BRRB.  She is not having any abdominal pain.  Hepatic Function Latest Ref Rng 11/19/2015 10/13/2015 09/14/2015  Total Protein 6.1 - 8.1 g/dL 6.9 7.2 7.6  Albumin 3.6 - 5.1 g/dL 4.2 4.2 4.5  AST 10 - 35 U/L 25 31 37(H)  ALT 6 - 29 U/L 33(H) 51(H) 68(H)  Alk Phosphatase 33 - 130 U/L 71 60 74  Total Bilirubin 0.2 - 1.2 mg/dL 0.5 0.5 0.6  Bilirubin, Direct <=0.2 mg/dL 0.1 0.1 0.1   Lipase     Component Value Date/Time   LIPASE 52 10/13/2015 0753          08/03/2015 EGD/ED:dysphagia Diffuse mild mucosal changes characterized by granularity and altered texture were found in the entire esophagus. A low grade Schatzki (acquired) was found at GE junction. A 2cm hiatal hernia was present.  Biopsy: chronic gastritis. Negative for h. pylori. No intestinal metaplasia, dysplasia, or malignancy.  07/31/2014 CT Abdomen/pelvis with CM: abdominal pain: IMPRESSION: Mild inflammatory changes of the second portion of the duodenum. A 2 cm diverticulum is noted arising from the medial aspect of the second portion of the duodenum. Findings likely represent duodenitis and less likely  diverticulitis of the duodenum. Acute pancreatitis with secondary inflammatory changes of the duodenum is not excluded in the presence of elevated pancreatic enzymes. Correlation with clinical exam recommended.  07/31/2015 US abdomen:      CLINICAL DATA: Epigastric pain, elevated LFTs, possible gallstones  MPRESSION: Unremarkable right upper quadrant ultrasound.         01/28/2014 Colonoscopy  Indications: Patient is 70 year old Caucasian female who is here for diagnostic colonoscopy. About two months ago she was treated for sigmoid diverticulitis. She is now symptom free. Last colonoscopy was in 2006. Family history is negative for CRC to  Impression:  Examination performed to cecum. Sigmoid colon diverticulosis otherwise normal colonoscopy    Review of Systems Past Medical History  Diagnosis Date  . Diverticulitis   . Hypothyroidism     Past Surgical History  Procedure Laterality Date  . Dilation and curettage of uterus      miscarriage  . Appendectomy      6-7 yrs ago  . Colonoscopy    . Colonoscopy N/A 01/28/2014    Procedure: COLONOSCOPY;  Surgeon: Rogene Houston, MD;  Location: [facility];  Service: Endoscopy;  Laterality: N/A;  200  . Esophagogastroduodenoscopy N/A 08/03/2015    Procedure: ESOPHAGOGASTRODUODENOSCOPY (EGD);  Surgeon: Rogene Houston, MD;  Location: [facility];  Service: Endoscopy;  Laterality: N/A;  . Esophageal dilation  08/03/2015    Procedure: ESOPHAGEAL DILATION;  Surgeon: Rogene Houston, MD;  Location: [facility];  Service:  Endoscopy;;    Allergies  Allergen Reactions  . Sulfa Antibiotics     Current Outpatient Prescriptions on File Prior to Visit  Medication Sig Dispense Refill  . Calcium Carbonate-Vit D-Min (CALCIUM 600+D3 PLUS MINERALS) 600-800 MG-UNIT TABS Take 1 capsule by mouth daily. Reported on 08/18/2015    . glucosamine-chondroitin 500-400 MG tablet Take 2 tablets by mouth daily. Reported on 08/18/2015    .  levothyroxine (SYNTHROID, LEVOTHROID) 100 MCG tablet Take 100 mcg by mouth daily before breakfast.    . Multiple Vitamins-Minerals ([employer] SPECTRAVITE ADVANCED PO) Take 1 tablet by mouth daily.     . ondansetron (ZOFRAN ODT) 8 MG disintegrating tablet Take 1 tablet (8 mg total) by mouth every 8 (eight) hours as needed for nausea or vomiting. 12 tablet 0  . pantoprazole (PROTONIX) 40 MG tablet Take 1 tablet (40 mg total) by mouth daily. 30 tablet 0   No current facility-administered medications on file prior to visit.        Objective:   Physical ExamBlood pressure 134/72, pulse 64, temperature 97.4 F (36.3 C), height 5' 7"  (1.702 m), weight 149 lb 14.4 oz (67.994 kg).  Alert and oriented. Skin warm and dry. Oral mucosa is moist.   . Sclera anicteric, conjunctivae is pink. Thyroid not enlarged. No cervical lymphadenopathy. Lungs clear. Heart regular rate and rhythm.  Abdomen is soft. Bowel sounds are positive. No hepatomegaly. No abdominal masses felt. No tenderness.  No edema to lower extremities.         Assessment & Plan:  Elevated liver enzymes. Liver enzymes are almost normal at this time.  Lipase is normal.  OV in 6 months. Hepatic function and Lipase in 4 weeks.

## 2015-11-24 ENCOUNTER — Other Ambulatory Visit (INDEPENDENT_AMBULATORY_CARE_PROVIDER_SITE_OTHER): Payer: Self-pay | Admitting: *Deleted

## 2015-11-24 ENCOUNTER — Encounter (INDEPENDENT_AMBULATORY_CARE_PROVIDER_SITE_OTHER): Payer: Self-pay

## 2015-11-24 DIAGNOSIS — R748 Abnormal levels of other serum enzymes: Secondary | ICD-10-CM

## 2015-11-26 DIAGNOSIS — M25461 Effusion, right knee: Secondary | ICD-10-CM | POA: Diagnosis not present

## 2015-11-26 DIAGNOSIS — M65861 Other synovitis and tenosynovitis, right lower leg: Secondary | ICD-10-CM | POA: Diagnosis not present

## 2015-11-26 DIAGNOSIS — M1711 Unilateral primary osteoarthritis, right knee: Secondary | ICD-10-CM | POA: Diagnosis not present

## 2015-12-02 ENCOUNTER — Other Ambulatory Visit (INDEPENDENT_AMBULATORY_CARE_PROVIDER_SITE_OTHER): Payer: Self-pay | Admitting: *Deleted

## 2015-12-02 ENCOUNTER — Encounter (INDEPENDENT_AMBULATORY_CARE_PROVIDER_SITE_OTHER): Payer: Self-pay | Admitting: *Deleted

## 2015-12-02 DIAGNOSIS — R748 Abnormal levels of other serum enzymes: Secondary | ICD-10-CM

## 2015-12-22 DIAGNOSIS — R748 Abnormal levels of other serum enzymes: Secondary | ICD-10-CM | POA: Diagnosis not present

## 2015-12-22 LAB — HEPATIC FUNCTION PANEL
ALBUMIN: 4.2 g/dL (ref 3.6–5.1)
ALT: 40 U/L — ABNORMAL HIGH (ref 6–29)
AST: 28 U/L (ref 10–35)
Alkaline Phosphatase: 76 U/L (ref 33–130)
BILIRUBIN TOTAL: 0.4 mg/dL (ref 0.2–1.2)
Bilirubin, Direct: 0.1 mg/dL (ref <=0.2)
Indirect Bilirubin: 0.3 mg/dL (ref 0.2–1.2)
Total Protein: 7.1 g/dL (ref 6.1–8.1)

## 2015-12-22 LAB — LIPASE: LIPASE: 60 U/L (ref 7–60)

## 2016-01-24 DIAGNOSIS — K219 Gastro-esophageal reflux disease without esophagitis: Secondary | ICD-10-CM | POA: Diagnosis not present

## 2016-01-24 DIAGNOSIS — E039 Hypothyroidism, unspecified: Secondary | ICD-10-CM | POA: Diagnosis not present

## 2016-01-24 DIAGNOSIS — K5730 Diverticulosis of large intestine without perforation or abscess without bleeding: Secondary | ICD-10-CM | POA: Diagnosis not present

## 2016-01-24 DIAGNOSIS — K7581 Nonalcoholic steatohepatitis (NASH): Secondary | ICD-10-CM | POA: Diagnosis not present

## 2016-01-24 DIAGNOSIS — E782 Mixed hyperlipidemia: Secondary | ICD-10-CM | POA: Diagnosis not present

## 2016-01-25 DIAGNOSIS — K5730 Diverticulosis of large intestine without perforation or abscess without bleeding: Secondary | ICD-10-CM | POA: Diagnosis not present

## 2016-01-25 DIAGNOSIS — Z23 Encounter for immunization: Secondary | ICD-10-CM | POA: Diagnosis not present

## 2016-01-25 DIAGNOSIS — E782 Mixed hyperlipidemia: Secondary | ICD-10-CM | POA: Diagnosis not present

## 2016-01-25 DIAGNOSIS — M20011 Mallet finger of right finger(s): Secondary | ICD-10-CM | POA: Diagnosis not present

## 2016-01-25 DIAGNOSIS — Z9189 Other specified personal risk factors, not elsewhere classified: Secondary | ICD-10-CM | POA: Diagnosis not present

## 2016-01-25 DIAGNOSIS — E039 Hypothyroidism, unspecified: Secondary | ICD-10-CM | POA: Diagnosis not present

## 2016-03-01 DIAGNOSIS — H5203 Hypermetropia, bilateral: Secondary | ICD-10-CM | POA: Diagnosis not present

## 2016-03-01 DIAGNOSIS — H524 Presbyopia: Secondary | ICD-10-CM | POA: Diagnosis not present

## 2016-03-01 DIAGNOSIS — H52223 Regular astigmatism, bilateral: Secondary | ICD-10-CM | POA: Diagnosis not present

## 2016-03-01 DIAGNOSIS — H2513 Age-related nuclear cataract, bilateral: Secondary | ICD-10-CM | POA: Diagnosis not present

## 2016-04-01 DIAGNOSIS — Z6824 Body mass index (BMI) 24.0-24.9, adult: Secondary | ICD-10-CM | POA: Diagnosis not present

## 2016-04-01 DIAGNOSIS — R2242 Localized swelling, mass and lump, left lower limb: Secondary | ICD-10-CM | POA: Diagnosis not present

## 2016-05-25 ENCOUNTER — Ambulatory Visit (INDEPENDENT_AMBULATORY_CARE_PROVIDER_SITE_OTHER): Payer: Medicare Other | Admitting: Internal Medicine

## 2016-05-30 ENCOUNTER — Ambulatory Visit (INDEPENDENT_AMBULATORY_CARE_PROVIDER_SITE_OTHER): Payer: Medicare Other | Admitting: Internal Medicine

## 2016-05-30 ENCOUNTER — Encounter (INDEPENDENT_AMBULATORY_CARE_PROVIDER_SITE_OTHER): Payer: Self-pay

## 2016-05-30 ENCOUNTER — Encounter (INDEPENDENT_AMBULATORY_CARE_PROVIDER_SITE_OTHER): Payer: Self-pay | Admitting: Internal Medicine

## 2016-05-30 VITALS — BP 134/90 | HR 60 | Temp 97.6°F | Ht 67.0 in | Wt 156.6 lb

## 2016-05-30 DIAGNOSIS — K219 Gastro-esophageal reflux disease without esophagitis: Secondary | ICD-10-CM | POA: Diagnosis not present

## 2016-05-30 DIAGNOSIS — R748 Abnormal levels of other serum enzymes: Secondary | ICD-10-CM | POA: Diagnosis not present

## 2016-05-30 NOTE — Patient Instructions (Addendum)
Hepatic.  GERD: Continue Protonix.

## 2016-05-30 NOTE — Progress Notes (Signed)
Subjective:    Patient ID: Desiree Flores, female    DOB: [birth year], 71 y.o.   MRN: [id number]  HPI Here today for f/u of her elevated liver enzymes. Last seen in July.  She tells me she is doing good. Her appetite is good. No weight loss. She has a BM daily. No melena or BRRB.  Lat set of enzymes were near normal in August of 2017;.  Had bump in enzymes while in hospital in 2017.   07/31/2015 US abdomen RUQ: IMPRESSION: Unremarkable right upper quadrant ultrasound.    08/03/2015 EGD: dysphagia      The hypopharynx was normal. Impression:               - Granular, texture changed mucosa in the                            esophagus. Biopsied.                           - Low-grade of narrowing Schatzki ring.                           - Granular mucosa noted at gastric body. Biopsies                            taken for routine histology.                           - 2 cm hiatal hernia. Biopsy:  Biopsy: chronic gastritis. Negative for h. pylori. No intestinal metaplasia, dysplasia, or malignancy.    Hepatic Function Latest Ref Rng & Units 12/22/2015 11/19/2015 10/13/2015  Total Protein 6.1 - 8.1 g/dL 7.1 6.9 7.2  Albumin 3.6 - 5.1 g/dL 4.2 4.2 4.2  AST 10 - 35 U/L 28 25 31   ALT 6 - 29 U/L 40(H) 33(H) 51(H)  Alk Phosphatase 33 - 130 U/L 76 71 60  Total Bilirubin 0.2 - 1.2 mg/dL 0.4 0.5 0.5  Bilirubin, Direct <=0.2 mg/dL 0.1 0.1 0.1     07/30/2015 Hepatic AST 32, ALT 53, ALP 68 (normal).    Review of Systems Past Medical History:  Diagnosis Date  . Diverticulitis   . Hypothyroidism     Past Surgical History:  Procedure Laterality Date  . APPENDECTOMY     6-7 yrs ago  . COLONOSCOPY    . COLONOSCOPY N/A 01/28/2014   Procedure: COLONOSCOPY;  Surgeon: Rogene Houston, MD;  Location: [facility];  Service: Endoscopy;  Laterality: N/A;  200  . DILATION AND CURETTAGE OF UTERUS     miscarriage  . ESOPHAGEAL DILATION  08/03/2015   Procedure: ESOPHAGEAL DILATION;  Surgeon:  Rogene Houston, MD;  Location: [facility];  Service: Endoscopy;;  . ESOPHAGOGASTRODUODENOSCOPY N/A 08/03/2015   Procedure: ESOPHAGOGASTRODUODENOSCOPY (EGD);  Surgeon: Rogene Houston, MD;  Location: [facility];  Service: Endoscopy;  Laterality: N/A;    Allergies  Allergen Reactions  . Sulfa Antibiotics     Current Outpatient Prescriptions on File Prior to Visit  Medication Sig Dispense Refill  . Calcium Carbonate-Vit D-Min (CALCIUM 600+D3 PLUS MINERALS) 600-800 MG-UNIT TABS Take 1 capsule by mouth daily. Reported on 08/18/2015    . glucosamine-chondroitin 500-400 MG tablet Take 2 tablets by mouth daily. Reported on 08/18/2015    .  levothyroxine (SYNTHROID, LEVOTHROID) 100 MCG tablet Take 100 mcg by mouth daily before breakfast.    . milk thistle 175 MG tablet Take by mouth daily.    . Multiple Vitamins-Minerals ([employer] SPECTRAVITE ADVANCED PO) Take 1 tablet by mouth daily.     . ondansetron (ZOFRAN ODT) 8 MG disintegrating tablet Take 1 tablet (8 mg total) by mouth every 8 (eight) hours as needed for nausea or vomiting. 12 tablet 0  . pantoprazole (PROTONIX) 40 MG tablet Take 1 tablet (40 mg total) by mouth daily. 30 tablet 0   No current facility-administered medications on file prior to visit.        Objective:   Physical Exam Blood pressure 134/90, pulse 60, temperature 97.6 F (36.4 C), height 5' 7"  (1.702 m), weight 156 lb 9.6 oz (71 kg).  Alert and oriented. Skin warm and dry. Oral mucosa is moist.   . Sclera anicteric, conjunctivae is pink. Thyroid not enlarged. No cervical lymphadenopathy. Lungs clear. Heart regular rate and rhythm.  Abdomen is soft. Bowel sounds are positive. No hepatomegaly. No abdominal masses felt. No tenderness.  No edema to lower extremities.  .       Assessment & Plan:  Elevated liver enzymes. Numbers are better. Will repeat today  OV in one year.

## 2016-05-31 LAB — HEPATIC FUNCTION PANEL
ALBUMIN: 4 g/dL (ref 3.6–5.1)
ALK PHOS: 77 U/L (ref 33–130)
ALT: 30 U/L — AB (ref 6–29)
AST: 26 U/L (ref 10–35)
Bilirubin, Direct: 0.1 mg/dL (ref <=0.2)
Indirect Bilirubin: 0.4 mg/dL (ref 0.2–1.2)
Total Bilirubin: 0.5 mg/dL (ref 0.2–1.2)
Total Protein: 7.2 g/dL (ref 6.1–8.1)

## 2016-06-01 ENCOUNTER — Other Ambulatory Visit (INDEPENDENT_AMBULATORY_CARE_PROVIDER_SITE_OTHER): Payer: Self-pay | Admitting: *Deleted

## 2016-06-01 DIAGNOSIS — R748 Abnormal levels of other serum enzymes: Secondary | ICD-10-CM

## 2016-07-03 DIAGNOSIS — Z6824 Body mass index (BMI) 24.0-24.9, adult: Secondary | ICD-10-CM | POA: Diagnosis not present

## 2016-07-03 DIAGNOSIS — K5792 Diverticulitis of intestine, part unspecified, without perforation or abscess without bleeding: Secondary | ICD-10-CM | POA: Diagnosis not present

## 2016-07-03 DIAGNOSIS — R300 Dysuria: Secondary | ICD-10-CM | POA: Diagnosis not present

## 2016-07-24 DIAGNOSIS — E782 Mixed hyperlipidemia: Secondary | ICD-10-CM | POA: Diagnosis not present

## 2016-07-24 DIAGNOSIS — K219 Gastro-esophageal reflux disease without esophagitis: Secondary | ICD-10-CM | POA: Diagnosis not present

## 2016-07-24 DIAGNOSIS — Z9189 Other specified personal risk factors, not elsewhere classified: Secondary | ICD-10-CM | POA: Diagnosis not present

## 2016-07-24 DIAGNOSIS — E039 Hypothyroidism, unspecified: Secondary | ICD-10-CM | POA: Diagnosis not present

## 2016-07-27 DIAGNOSIS — K5730 Diverticulosis of large intestine without perforation or abscess without bleeding: Secondary | ICD-10-CM | POA: Diagnosis not present

## 2016-07-27 DIAGNOSIS — E039 Hypothyroidism, unspecified: Secondary | ICD-10-CM | POA: Diagnosis not present

## 2016-07-27 DIAGNOSIS — Z1212 Encounter for screening for malignant neoplasm of rectum: Secondary | ICD-10-CM | POA: Diagnosis not present

## 2016-07-27 DIAGNOSIS — E782 Mixed hyperlipidemia: Secondary | ICD-10-CM | POA: Diagnosis not present

## 2016-07-27 DIAGNOSIS — K7581 Nonalcoholic steatohepatitis (NASH): Secondary | ICD-10-CM | POA: Diagnosis not present

## 2016-07-27 DIAGNOSIS — Z0001 Encounter for general adult medical examination with abnormal findings: Secondary | ICD-10-CM | POA: Diagnosis not present

## 2016-08-08 DIAGNOSIS — Z6824 Body mass index (BMI) 24.0-24.9, adult: Secondary | ICD-10-CM | POA: Diagnosis not present

## 2016-08-08 DIAGNOSIS — R233 Spontaneous ecchymoses: Secondary | ICD-10-CM | POA: Diagnosis not present

## 2017-01-18 DIAGNOSIS — K760 Fatty (change of) liver, not elsewhere classified: Secondary | ICD-10-CM | POA: Diagnosis not present

## 2017-01-18 DIAGNOSIS — Z9189 Other specified personal risk factors, not elsewhere classified: Secondary | ICD-10-CM | POA: Diagnosis not present

## 2017-01-18 DIAGNOSIS — E039 Hypothyroidism, unspecified: Secondary | ICD-10-CM | POA: Diagnosis not present

## 2017-01-18 DIAGNOSIS — K219 Gastro-esophageal reflux disease without esophagitis: Secondary | ICD-10-CM | POA: Diagnosis not present

## 2017-01-24 DIAGNOSIS — Z23 Encounter for immunization: Secondary | ICD-10-CM | POA: Diagnosis not present

## 2017-01-24 DIAGNOSIS — E039 Hypothyroidism, unspecified: Secondary | ICD-10-CM | POA: Diagnosis not present

## 2017-01-24 DIAGNOSIS — K5730 Diverticulosis of large intestine without perforation or abscess without bleeding: Secondary | ICD-10-CM | POA: Diagnosis not present

## 2017-01-24 DIAGNOSIS — K7581 Nonalcoholic steatohepatitis (NASH): Secondary | ICD-10-CM | POA: Diagnosis not present

## 2017-01-24 DIAGNOSIS — E782 Mixed hyperlipidemia: Secondary | ICD-10-CM | POA: Diagnosis not present

## 2017-03-14 DIAGNOSIS — M5030 Other cervical disc degeneration, unspecified cervical region: Secondary | ICD-10-CM | POA: Diagnosis not present

## 2017-05-07 ENCOUNTER — Encounter (INDEPENDENT_AMBULATORY_CARE_PROVIDER_SITE_OTHER): Payer: Self-pay | Admitting: Internal Medicine

## 2017-08-14 ENCOUNTER — Encounter: Payer: Self-pay | Admitting: Internal Medicine

## 2017-08-14 DIAGNOSIS — Z0000 Encounter for general adult medical examination without abnormal findings: Secondary | ICD-10-CM | POA: Diagnosis not present

## 2017-08-14 DIAGNOSIS — E039 Hypothyroidism, unspecified: Secondary | ICD-10-CM | POA: Diagnosis not present

## 2017-08-14 DIAGNOSIS — K7581 Nonalcoholic steatohepatitis (NASH): Secondary | ICD-10-CM | POA: Diagnosis not present

## 2017-08-14 DIAGNOSIS — K219 Gastro-esophageal reflux disease without esophagitis: Secondary | ICD-10-CM | POA: Diagnosis not present

## 2017-08-14 DIAGNOSIS — Z6825 Body mass index (BMI) 25.0-25.9, adult: Secondary | ICD-10-CM | POA: Diagnosis not present

## 2017-08-14 DIAGNOSIS — R5383 Other fatigue: Secondary | ICD-10-CM | POA: Diagnosis not present

## 2017-08-14 DIAGNOSIS — E782 Mixed hyperlipidemia: Secondary | ICD-10-CM | POA: Diagnosis not present

## 2017-08-17 DIAGNOSIS — M7552 Bursitis of left shoulder: Secondary | ICD-10-CM | POA: Diagnosis not present

## 2017-08-17 DIAGNOSIS — Z6824 Body mass index (BMI) 24.0-24.9, adult: Secondary | ICD-10-CM | POA: Diagnosis not present

## 2017-08-17 DIAGNOSIS — E782 Mixed hyperlipidemia: Secondary | ICD-10-CM | POA: Diagnosis not present

## 2017-08-17 DIAGNOSIS — K7581 Nonalcoholic steatohepatitis (NASH): Secondary | ICD-10-CM | POA: Diagnosis not present

## 2017-08-17 DIAGNOSIS — Z1212 Encounter for screening for malignant neoplasm of rectum: Secondary | ICD-10-CM | POA: Diagnosis not present

## 2017-08-17 DIAGNOSIS — Z23 Encounter for immunization: Secondary | ICD-10-CM | POA: Diagnosis not present

## 2017-08-17 DIAGNOSIS — Z0001 Encounter for general adult medical examination with abnormal findings: Secondary | ICD-10-CM | POA: Diagnosis not present

## 2017-08-17 DIAGNOSIS — E039 Hypothyroidism, unspecified: Secondary | ICD-10-CM | POA: Diagnosis not present

## 2017-08-21 ENCOUNTER — Encounter (INDEPENDENT_AMBULATORY_CARE_PROVIDER_SITE_OTHER): Payer: Self-pay | Admitting: Internal Medicine

## 2017-08-21 ENCOUNTER — Encounter (INDEPENDENT_AMBULATORY_CARE_PROVIDER_SITE_OTHER): Payer: Self-pay | Admitting: *Deleted

## 2017-08-21 ENCOUNTER — Ambulatory Visit (INDEPENDENT_AMBULATORY_CARE_PROVIDER_SITE_OTHER): Payer: Medicare HMO | Admitting: Internal Medicine

## 2017-08-21 VITALS — BP 140/80 | HR 64 | Temp 97.9°F | Ht 66.5 in | Wt 161.6 lb

## 2017-08-21 DIAGNOSIS — R1319 Other dysphagia: Secondary | ICD-10-CM | POA: Insufficient documentation

## 2017-08-21 DIAGNOSIS — R1310 Dysphagia, unspecified: Secondary | ICD-10-CM

## 2017-08-21 NOTE — Patient Instructions (Signed)
The risks of bleeding, perforation and infection were reviewed with patient.  

## 2017-08-21 NOTE — Progress Notes (Signed)
Subjective:    Patient ID: Desiree Flores, female    DOB: 01-Apr-1946, 72 y.o.   MRN: [id number]  Wt 156 1/23/208 HPI Here today for f/u. Last seen In January of 2018.  Hx of elevated liver enzymes.  Enzymes in January of 2018 were normal. [place] 08/02/2015 was normal. She tells me she is having some dysphagia. Has occurred x 3 over the past year. She says when it she cannot even drink water.  Her appetite is good. No weight loss.  BMs are normal.  Her last colonoscopy was in 2015 which was normal.  08/14/2017 total bili 0.4, ALP 73, AST 34, ALT 47   Hepatic Function Latest Ref Rng & Units 05/30/2016 12/22/2015 11/19/2015  Total Protein 6.1 - 8.1 g/dL 7.2 7.1 6.9  Albumin 3.6 - 5.1 g/dL 4.0 4.2 4.2  AST 10 - 35 U/L 26 28 25   ALT 6 - 29 U/L 30(H) 40(H) 33(H)  Alk Phosphatase 33 - 130 U/L 77 76 71  Total Bilirubin 0.2 - 1.2 mg/dL 0.5 0.4 0.5  Bilirubin, Direct <=0.2 mg/dL 0.1 0.1 0.1   08/03/2015 EGD/ED:dysphagia Diffuse mild mucosal changes characterized by granularity and altered texture were found in the entire esophagus. A low grade Schatzki (acquired) was found at GE junction.  A 2cm hiatal hernia was present.  Biopsy: chronic gastritis. Negative for h. pylori. No intestinal metaplasia, dysplasia, or malignancy.    Review of Systems Past Medical History:  Diagnosis Date  . Diverticulitis   . Hypothyroidism     Past Surgical History:  Procedure Laterality Date  . APPENDECTOMY     6-7 yrs ago  . COLONOSCOPY    . COLONOSCOPY N/A 01/28/2014   Procedure: COLONOSCOPY;  Surgeon: Rogene Houston, MD;  Location: [facility];  Service: Endoscopy;  Laterality: N/A;  200  . DILATION AND CURETTAGE OF UTERUS     miscarriage  . ESOPHAGEAL DILATION  08/03/2015   Procedure: ESOPHAGEAL DILATION;  Surgeon: Rogene Houston, MD;  Location: [facility];  Service: Endoscopy;;  . ESOPHAGOGASTRODUODENOSCOPY N/A 08/03/2015   Procedure: ESOPHAGOGASTRODUODENOSCOPY (EGD);  Surgeon: Rogene Houston, MD;   Location: [facility];  Service: Endoscopy;  Laterality: N/A;    Allergies  Allergen Reactions  . Sulfa Antibiotics     Current Outpatient Medications on File Prior to Visit  Medication Sig Dispense Refill  . levothyroxine (SYNTHROID, LEVOTHROID) 100 MCG tablet Take 100 mcg by mouth daily before breakfast.    . pantoprazole (PROTONIX) 40 MG tablet Take 1 tablet (40 mg total) by mouth daily. 30 tablet 0  . Calcium Carbonate-Vit D-Min (CALCIUM 600+D3 PLUS MINERALS) 600-800 MG-UNIT TABS Take 1 capsule by mouth daily. Reported on 08/18/2015    . glucosamine-chondroitin 500-400 MG tablet Take 2 tablets by mouth daily. Reported on 08/18/2015    . milk thistle 175 MG tablet Take by mouth daily.    . Multiple Vitamins-Minerals ([employer] SPECTRAVITE ADVANCED PO) Take 1 tablet by mouth daily.     . ondansetron (ZOFRAN ODT) 8 MG disintegrating tablet Take 1 tablet (8 mg total) by mouth every 8 (eight) hours as needed for nausea or vomiting. 12 tablet 0   No current facility-administered medications on file prior to visit.         Objective:   Physical Exam Blood pressure 140/80, pulse 64, temperature 97.9 F (36.6 C), height 5' 6.5" (1.689 m), weight 161 lb 9.6 oz (73.3 kg). Alert and oriented. Skin warm and dry. Oral mucosa is  moist.   . Sclera anicteric, conjunctivae is pink. Thyroid not enlarged. No cervical lymphadenopathy. Lungs clear. Heart regular rate and rhythm.  Abdomen is soft. Bowel sounds are positive. No hepatomegaly. No abdominal masses felt. No tenderness.  No edema to lower extremities.           Assessment & Plan:  Esophageal dysphagia. EGD/ED. Hx of Schatzki. The risks of bleeding, perforation and infection were reviewed with patient.

## 2017-08-28 DIAGNOSIS — Z1231 Encounter for screening mammogram for malignant neoplasm of breast: Secondary | ICD-10-CM | POA: Diagnosis not present

## 2017-09-19 ENCOUNTER — Encounter (HOSPITAL_COMMUNITY)
Admission: RE | Disposition: A | Discharge status: Home / Self Care | Payer: Self-pay | Source: Ambulatory Visit | Attending: Internal Medicine

## 2017-09-19 ENCOUNTER — Encounter (HOSPITAL_COMMUNITY): Payer: Self-pay | Admitting: *Deleted

## 2017-09-19 ENCOUNTER — Other Ambulatory Visit (INDEPENDENT_AMBULATORY_CARE_PROVIDER_SITE_OTHER): Payer: Self-pay | Admitting: *Deleted

## 2017-09-19 ENCOUNTER — Ambulatory Visit (HOSPITAL_COMMUNITY)
Admission: RE | Admit: 2017-09-19 | Discharge: 2017-09-19 | Disposition: A | Discharge status: Home / Self Care | Payer: Medicare HMO | Source: Ambulatory Visit | Attending: Internal Medicine | Admitting: Internal Medicine

## 2017-09-19 ENCOUNTER — Other Ambulatory Visit: Payer: Self-pay

## 2017-09-19 DIAGNOSIS — Z882 Allergy status to sulfonamides status: Secondary | ICD-10-CM | POA: Insufficient documentation

## 2017-09-19 DIAGNOSIS — E039 Hypothyroidism, unspecified: Secondary | ICD-10-CM | POA: Diagnosis not present

## 2017-09-19 DIAGNOSIS — R1013 Epigastric pain: Secondary | ICD-10-CM | POA: Diagnosis not present

## 2017-09-19 DIAGNOSIS — R1319 Other dysphagia: Secondary | ICD-10-CM | POA: Insufficient documentation

## 2017-09-19 DIAGNOSIS — Z79899 Other long term (current) drug therapy: Secondary | ICD-10-CM | POA: Insufficient documentation

## 2017-09-19 DIAGNOSIS — R1314 Dysphagia, pharyngoesophageal phase: Secondary | ICD-10-CM | POA: Insufficient documentation

## 2017-09-19 DIAGNOSIS — K210 Gastro-esophageal reflux disease with esophagitis: Secondary | ICD-10-CM | POA: Insufficient documentation

## 2017-09-19 DIAGNOSIS — K222 Esophageal obstruction: Secondary | ICD-10-CM | POA: Insufficient documentation

## 2017-09-19 DIAGNOSIS — K449 Diaphragmatic hernia without obstruction or gangrene: Secondary | ICD-10-CM | POA: Diagnosis not present

## 2017-09-19 DIAGNOSIS — R1310 Dysphagia, unspecified: Secondary | ICD-10-CM | POA: Insufficient documentation

## 2017-09-19 HISTORY — PX: ESOPHAGOGASTRODUODENOSCOPY: SHX5428

## 2017-09-19 HISTORY — PX: ESOPHAGEAL DILATION: SHX303

## 2017-09-19 SURGERY — EGD (ESOPHAGOGASTRODUODENOSCOPY)
Anesthesia: Moderate Sedation

## 2017-09-19 MED ORDER — SODIUM CHLORIDE 0.9 % IV SOLN
INTRAVENOUS | Status: DC
  Administered 2017-09-19: 07:00:00 via INTRAVENOUS

## 2017-09-19 MED ORDER — STERILE WATER FOR IRRIGATION IR SOLN
Status: DC | PRN
  Administered 2017-09-19: 100 mL

## 2017-09-19 MED ORDER — SIMETHICONE 40 MG/0.6ML PO SUSP
ORAL | Status: AC
  Filled 2017-09-19: qty 30

## 2017-09-19 MED ORDER — LIDOCAINE VISCOUS HCL 2 % MT SOLN
OROMUCOSAL | Status: AC
  Filled 2017-09-19: qty 15

## 2017-09-19 MED ORDER — MEPERIDINE HCL 50 MG/ML IJ SOLN
INTRAMUSCULAR | Status: DC | PRN
  Administered 2017-09-19 (×2): 25 mg via INTRAVENOUS

## 2017-09-19 MED ORDER — MIDAZOLAM HCL 5 MG/5ML IJ SOLN
INTRAMUSCULAR | Status: DC | PRN
  Administered 2017-09-19 (×3): 1 mg via INTRAVENOUS
  Administered 2017-09-19 (×2): 2 mg via INTRAVENOUS

## 2017-09-19 MED ORDER — MIDAZOLAM HCL 5 MG/5ML IJ SOLN
INTRAMUSCULAR | Status: AC
  Filled 2017-09-19: qty 10

## 2017-09-19 MED ORDER — LIDOCAINE VISCOUS HCL 2 % MT SOLN
OROMUCOSAL | Status: DC | PRN
  Administered 2017-09-19: 4 mL via OROMUCOSAL

## 2017-09-19 MED ORDER — MEPERIDINE HCL 50 MG/ML IJ SOLN
INTRAMUSCULAR | Status: AC
  Filled 2017-09-19: qty 1

## 2017-09-19 NOTE — H&P (Signed)
Desiree Flores is an 73 y.o. female.   Chief Complaint: Patient is here for EGD and ED. HPI: Patient is 72 year old Caucasian female who has a history of Schatzki's ring which was last dilated/disrupted in March 2017 who presents with dysphagia 1 months duration.  She had an episode of food impaction 4 weeks ago.  She noted food bolus getting stuck in the lower sternal area.  She was able to get relief when she regurgitated food.  She has had no difficulty since then but she has been eating slowly and chewing her food well.  She also gives a history of sudden onset of sharp pain in the lower sternal epigastric region radiating to the right side about 2-1/2 weeks ago while she was sitting in church.  She had not eaten her breakfast.  She did not have nausea vomiting shortness of breath or diaphoresis.  She recalls she had had 2 episodes like this before.  She does not recall having an ultrasound to look for cholelithiasis. She has good appetite and her weight has been stable.  She denies melena. He does not take OTC NSAIDs.  Past Medical History:  Diagnosis Date  . Diverticulitis   . Hypothyroidism     Past Surgical History:  Procedure Laterality Date  . APPENDECTOMY     6-7 yrs ago  . COLONOSCOPY    . COLONOSCOPY N/A 01/28/2014   Procedure: COLONOSCOPY;  Surgeon: Rogene Houston, MD;  Location: [facility];  Service: Endoscopy;  Laterality: N/A;  200  . DILATION AND CURETTAGE OF UTERUS     miscarriage  . ESOPHAGEAL DILATION  08/03/2015   Procedure: ESOPHAGEAL DILATION;  Surgeon: Rogene Houston, MD;  Location: [facility];  Service: Endoscopy;;  . ESOPHAGOGASTRODUODENOSCOPY N/A 08/03/2015   Procedure: ESOPHAGOGASTRODUODENOSCOPY (EGD);  Surgeon: Rogene Houston, MD;  Location: [facility];  Service: Endoscopy;  Laterality: N/A;    Family History  Problem Relation Age of Onset  . Colon cancer Other    Social History:  reports that she has never smoked. She has never used smokeless  tobacco. She reports that she does not drink alcohol or use drugs.  Allergies:  Allergies  Allergen Reactions  . Sulfa Antibiotics Other (See Comments)    Yeast infection    Medications Prior to Admission  Medication Sig Dispense Refill  . Calcium Carbonate-Vitamin D (CALCIUM-VITAMIN D3 PO) Take 1 tablet by mouth daily.    . Glucosamine HCl (GLUCOSAMINE PO) Take 2 tablets by mouth daily.    Marland Kitchen levothyroxine (SYNTHROID, LEVOTHROID) 100 MCG tablet Take 100 mcg by mouth daily before breakfast.    . Multiple Vitamins-Minerals (MULTIVITAMIN PO) Take 1 tablet by mouth daily.    Marland Kitchen acetaminophen (TYLENOL) 325 MG tablet Take 325-650 mg by mouth every 6 (six) hours as needed for moderate pain or headache.    . pantoprazole (PROTONIX) 40 MG tablet Take 1 tablet (40 mg total) by mouth daily. (Patient not taking: Reported on 09/18/2017) 30 tablet 0    No results found for this or any previous visit (from the past 48 hour(s)). No results found.  ROS  Blood pressure 136/68, pulse 79, temperature 97.8 F (36.6 C), temperature source Oral, resp. rate 12, height 5' 6.5" (1.689 m), weight 161 lb (73 kg), SpO2 96 %. Physical Exam  Constitutional: She appears well-developed and well-nourished.  HENT:  Mouth/Throat: Oropharynx is clear and moist.  Eyes: Conjunctivae are normal.  Neck: No thyromegaly present.  Cardiovascular: Normal rate, regular  rhythm and normal heart sounds.  No murmur heard. Respiratory: Effort normal and breath sounds normal.  GI: Soft. She exhibits no distension and no mass. There is no tenderness.  Laparoscopy scars from prior appendectomy.  Musculoskeletal: She exhibits no edema.  Lymphadenopathy:    She has no cervical adenopathy.  Neurological: She is alert.  Skin: Skin is warm and dry.     Assessment/Plan Solid food dysphagia. History of lower sternal/epigastric pain. EGD with ED.  Hildred Laser, MD 09/19/2017, 7:32 AM

## 2017-09-19 NOTE — Op Note (Signed)
[facility] Patient Name: Desiree Flores Procedure Date: 09/19/2017 7:09 AM MRN: [id number] Date of Birth: 1946/01/28 Attending MD: Hildred Laser , MD CSN: [id number] Age: 72 Admit Type: Outpatient Procedure:                Upper GI endoscopy Indications:              Epigastric abdominal pain, Esophageal dysphagia Providers:                Hildred Laser, MD, Rosina Lowenstein, RN, Randa Spike, Technician Referring MD:             Mitzie Na. Quillian Quince, MD Medicines:                Lidocaine spray, Meperidine 50 mg IV, Midazolam 7                            mg IV Complications:            No immediate complications. Estimated Blood Loss:     Estimated blood loss was minimal. Procedure:                Pre-Anesthesia Assessment:                           - Prior to the procedure, a History and Physical                            was performed, and patient medications and                            allergies were reviewed. The patient's tolerance of                            previous anesthesia was also reviewed. The risks                            and benefits of the procedure and the sedation                            options and risks were discussed with the patient.                            All questions were answered, and informed consent                            was obtained. Prior Anticoagulants: The patient has                            taken no previous anticoagulant or antiplatelet                            agents. ASA Grade Assessment: II - A patient with  mild systemic disease. After reviewing the risks                            and benefits, the patient was deemed in                            satisfactory condition to undergo the procedure.                           After obtaining informed consent, the endoscope was                            passed under direct vision. Throughout the   procedure, the patient's blood pressure, pulse, and                            oxygen saturations were monitored continuously. The                            [id number] ([id number]) scope was introduced through the                            mouth, and advanced to the second part of duodenum.                            The upper GI endoscopy was accomplished without                            difficulty. The patient tolerated the procedure                            well. Scope In: 7:45:04 AM Scope Out: 8:01:54 AM Total Procedure Duration: 0 hours 16 minutes 50 seconds  Findings:      The proximal esophagus and mid esophagus were normal.      LA Grade A (one or more mucosal breaks less than 5 mm, not extending       between tops of 2 mucosal folds) esophagitis was found 37 to 38 cm from       the incisors.      A moderate Schatzki ring was found at the gastroesophageal junction. The       scope was withdrawn. Dilation was attempted, but the lesion was not       amenable to treatment with a Maloney dilator because no resistance at 81       Fr. A TTS dilator was passed through the scope. Dilation with a       15-16.5-18 mm balloon dilator was performed to 15 mm, 16.5 mm and 18 mm.       The dilation site was examined and showed complete resolution of luminal       narrowing and no perforation.      A 2 cm hiatal hernia was present.      The entire examined stomach was normal.      The duodenal bulb and second portion of the duodenum were normal.       Biopsies were taken with a cold forceps for histology. Impression:               -  Normal proximal esophagus and mid esophagus.                           - LA Grade A reflux esophagitis.                           - Moderate Schatzki ring. Unable to dilate. Dilated.                           - 2 cm hiatal hernia.                           - Normal stomach.                           - Normal duodenal bulb and second portion of the                             duodenum. Biopsied. Moderate Sedation:      Moderate (conscious) sedation was administered by the endoscopy nurse       and supervised by the endoscopist. The following parameters were       monitored: oxygen saturation, heart rate, blood pressure, CO2       capnography and response to care. Total physician intraservice time was       22 minutes. Recommendation:           - Patient has a contact number available for                            emergencies. The signs and symptoms of potential                            delayed complications were discussed with the                            patient. Return to normal activities tomorrow.                            Written discharge instructions were provided to the                            patient.                           - Resume previous diet today.                           - Continue present medications.                           - Upper abdominal US to be scheduled. Procedure Code(s):        --- Professional ---                           [phone], Esophagogastroduodenoscopy, flexible,  transoral; with transendoscopic balloon dilation of                            esophagus (less than 30 mm diameter)                           43239, Esophagogastroduodenoscopy, flexible,                            transoral; with biopsy, single or multiple                           G0500, Moderate sedation services provided by the                            same physician or other qualified health care                            professional performing a gastrointestinal                            endoscopic service that sedation supports,                            requiring the presence of an independent trained                            observer to assist in the monitoring of the                            patient's level of consciousness and physiological                            status; initial 15 minutes of  intra-service time;                            patient age 73 years or older (additional time may                            be reported with [phone], as appropriate) Diagnosis Code(s):        --- Professional ---                           K21.0, Gastro-esophageal reflux disease with                            esophagitis                           K22.2, Esophageal obstruction                           K44.9, Diaphragmatic hernia without obstruction or                            gangrene  R10.13, Epigastric pain                           R13.14, Dysphagia, pharyngoesophageal phase CPT copyright 2017 American Medical Association. All rights reserved. The codes documented in this report are preliminary and upon coder review may  be revised to meet current compliance requirements. Hildred Laser, MD Hildred Laser, MD 09/19/2017 8:22:36 AM This report has been signed electronically. Number of Addenda: 0

## 2017-09-19 NOTE — Discharge Instructions (Signed)
No aspirin or NSAIDs for 24 hours. Resume usual medications including pantoprazole.  Take pantoprazole 40 mg by mouth 30 minutes before breakfast daily. Resume usual diet. No driving for 24 hours. Upper abdominal ultrasound to be scheduled.  Office will call.      Esophagogastroduodenoscopy, Care After Refer to this sheet in the next few weeks. These instructions provide you with information about caring for yourself after your procedure. Your health care provider may also give you more specific instructions. Your treatment has been planned according to current medical practices, but problems sometimes occur. Call your health care provider if you have any problems or questions after your procedure. What can I expect after the procedure? After the procedure, it is common to have:  A sore throat.  Nausea.  Bloating.  Dizziness.  Fatigue.  Follow these instructions at home:  Do not eat or drink anything until the numbing medicine (local anesthetic) has worn off and your gag reflex has returned. You will know that the local anesthetic has worn off when you can swallow comfortably.  Do not drive for 24 hours if you received a medicine to help you relax (sedative).  If your health care provider took a tissue sample for testing during the procedure, make sure to get your test results. This is your responsibility. Ask your health care provider or the department performing the test when your results will be ready.  Keep all follow-up visits as told by your health care provider. This is important. Contact a health care provider if:  You cannot stop coughing.  You are not urinating.  You are urinating less than usual. Get help right away if:  You have trouble swallowing.  You cannot eat or drink.  You have throat or chest pain that gets worse.  You are dizzy or light-headed.  You faint.  You have nausea or vomiting.  You have chills.  You have a fever.  You have severe  abdominal pain.  You have black, tarry, or bloody stools. This information is not intended to replace advice given to you by your health care provider. Make sure you discuss any questions you have with your health care provider. Document Released: 04/10/2012 Document Revised: 09/30/2015 Document Reviewed: 03/18/2015 Elsevier Interactive Patient Education  [employer].

## 2017-09-21 ENCOUNTER — Ambulatory Visit (HOSPITAL_COMMUNITY)
Admission: RE | Admit: 2017-09-21 | Discharge: 2017-09-21 | Disposition: A | Discharge status: Home / Self Care | Payer: Medicare HMO | Source: Ambulatory Visit | Attending: Internal Medicine | Admitting: Internal Medicine

## 2017-09-21 DIAGNOSIS — R1013 Epigastric pain: Secondary | ICD-10-CM | POA: Diagnosis not present

## 2017-09-21 DIAGNOSIS — R109 Unspecified abdominal pain: Secondary | ICD-10-CM | POA: Diagnosis not present

## 2017-09-24 ENCOUNTER — Encounter (HOSPITAL_COMMUNITY): Payer: Self-pay | Admitting: Internal Medicine

## 2018-01-09 ENCOUNTER — Ambulatory Visit (INDEPENDENT_AMBULATORY_CARE_PROVIDER_SITE_OTHER): Payer: Medicare HMO | Admitting: Internal Medicine

## 2018-02-05 DIAGNOSIS — K5792 Diverticulitis of intestine, part unspecified, without perforation or abscess without bleeding: Secondary | ICD-10-CM | POA: Diagnosis not present

## 2018-02-05 DIAGNOSIS — Z6823 Body mass index (BMI) 23.0-23.9, adult: Secondary | ICD-10-CM | POA: Diagnosis not present

## 2018-02-18 DIAGNOSIS — E039 Hypothyroidism, unspecified: Secondary | ICD-10-CM | POA: Diagnosis not present

## 2018-02-18 DIAGNOSIS — K5792 Diverticulitis of intestine, part unspecified, without perforation or abscess without bleeding: Secondary | ICD-10-CM | POA: Diagnosis not present

## 2018-02-18 DIAGNOSIS — K219 Gastro-esophageal reflux disease without esophagitis: Secondary | ICD-10-CM | POA: Diagnosis not present

## 2018-02-18 DIAGNOSIS — Z9189 Other specified personal risk factors, not elsewhere classified: Secondary | ICD-10-CM | POA: Diagnosis not present

## 2018-02-18 DIAGNOSIS — E782 Mixed hyperlipidemia: Secondary | ICD-10-CM | POA: Diagnosis not present

## 2018-02-18 DIAGNOSIS — K760 Fatty (change of) liver, not elsewhere classified: Secondary | ICD-10-CM | POA: Diagnosis not present

## 2018-02-18 DIAGNOSIS — Z23 Encounter for immunization: Secondary | ICD-10-CM | POA: Diagnosis not present

## 2018-02-18 DIAGNOSIS — K7581 Nonalcoholic steatohepatitis (NASH): Secondary | ICD-10-CM | POA: Diagnosis not present

## 2018-02-27 DIAGNOSIS — K5730 Diverticulosis of large intestine without perforation or abscess without bleeding: Secondary | ICD-10-CM | POA: Diagnosis not present

## 2018-02-27 DIAGNOSIS — E782 Mixed hyperlipidemia: Secondary | ICD-10-CM | POA: Diagnosis not present

## 2018-02-27 DIAGNOSIS — K7581 Nonalcoholic steatohepatitis (NASH): Secondary | ICD-10-CM | POA: Diagnosis not present

## 2018-02-27 DIAGNOSIS — E039 Hypothyroidism, unspecified: Secondary | ICD-10-CM | POA: Diagnosis not present

## 2018-02-27 DIAGNOSIS — Z6824 Body mass index (BMI) 24.0-24.9, adult: Secondary | ICD-10-CM | POA: Diagnosis not present

## 2018-03-27 DIAGNOSIS — H538 Other visual disturbances: Secondary | ICD-10-CM | POA: Diagnosis not present

## 2018-03-27 DIAGNOSIS — R030 Elevated blood-pressure reading, without diagnosis of hypertension: Secondary | ICD-10-CM | POA: Diagnosis not present

## 2018-03-27 DIAGNOSIS — H53461 Homonymous bilateral field defects, right side: Secondary | ICD-10-CM | POA: Diagnosis not present

## 2018-03-27 DIAGNOSIS — H359 Unspecified retinal disorder: Secondary | ICD-10-CM | POA: Diagnosis not present

## 2018-03-27 DIAGNOSIS — H5340 Unspecified visual field defects: Secondary | ICD-10-CM | POA: Diagnosis not present

## 2018-03-27 DIAGNOSIS — H04123 Dry eye syndrome of bilateral lacrimal glands: Secondary | ICD-10-CM | POA: Diagnosis not present

## 2018-03-27 DIAGNOSIS — H53129 Transient visual loss, unspecified eye: Secondary | ICD-10-CM | POA: Diagnosis not present

## 2018-03-27 DIAGNOSIS — Z79899 Other long term (current) drug therapy: Secondary | ICD-10-CM | POA: Diagnosis not present

## 2018-03-27 DIAGNOSIS — E039 Hypothyroidism, unspecified: Secondary | ICD-10-CM | POA: Diagnosis not present

## 2018-03-27 DIAGNOSIS — M549 Dorsalgia, unspecified: Secondary | ICD-10-CM | POA: Diagnosis not present

## 2018-03-27 DIAGNOSIS — H349 Unspecified retinal vascular occlusion: Secondary | ICD-10-CM | POA: Diagnosis not present

## 2018-03-27 DIAGNOSIS — M542 Cervicalgia: Secondary | ICD-10-CM | POA: Diagnosis not present

## 2018-04-01 DIAGNOSIS — H547 Unspecified visual loss: Secondary | ICD-10-CM | POA: Diagnosis not present

## 2018-04-26 DIAGNOSIS — H547 Unspecified visual loss: Secondary | ICD-10-CM | POA: Diagnosis not present

## 2018-08-22 ENCOUNTER — Ambulatory Visit (INDEPENDENT_AMBULATORY_CARE_PROVIDER_SITE_OTHER): Payer: Medicare HMO | Admitting: Internal Medicine

## 2018-11-19 DIAGNOSIS — Z6824 Body mass index (BMI) 24.0-24.9, adult: Secondary | ICD-10-CM | POA: Diagnosis not present

## 2018-11-19 DIAGNOSIS — N3090 Cystitis, unspecified without hematuria: Secondary | ICD-10-CM | POA: Diagnosis not present

## 2018-11-19 DIAGNOSIS — R319 Hematuria, unspecified: Secondary | ICD-10-CM | POA: Diagnosis not present

## 2019-01-27 DIAGNOSIS — K760 Fatty (change of) liver, not elsewhere classified: Secondary | ICD-10-CM | POA: Diagnosis not present

## 2019-01-27 DIAGNOSIS — K7581 Nonalcoholic steatohepatitis (NASH): Secondary | ICD-10-CM | POA: Diagnosis not present

## 2019-01-27 DIAGNOSIS — K219 Gastro-esophageal reflux disease without esophagitis: Secondary | ICD-10-CM | POA: Diagnosis not present

## 2019-01-27 DIAGNOSIS — R5383 Other fatigue: Secondary | ICD-10-CM | POA: Diagnosis not present

## 2019-01-27 DIAGNOSIS — K5792 Diverticulitis of intestine, part unspecified, without perforation or abscess without bleeding: Secondary | ICD-10-CM | POA: Diagnosis not present

## 2019-01-27 DIAGNOSIS — E782 Mixed hyperlipidemia: Secondary | ICD-10-CM | POA: Diagnosis not present

## 2019-01-27 DIAGNOSIS — E039 Hypothyroidism, unspecified: Secondary | ICD-10-CM | POA: Diagnosis not present

## 2019-01-30 DIAGNOSIS — K7581 Nonalcoholic steatohepatitis (NASH): Secondary | ICD-10-CM | POA: Diagnosis not present

## 2019-01-30 DIAGNOSIS — Z1212 Encounter for screening for malignant neoplasm of rectum: Secondary | ICD-10-CM | POA: Diagnosis not present

## 2019-01-30 DIAGNOSIS — Z23 Encounter for immunization: Secondary | ICD-10-CM | POA: Diagnosis not present

## 2019-01-30 DIAGNOSIS — E039 Hypothyroidism, unspecified: Secondary | ICD-10-CM | POA: Diagnosis not present

## 2019-01-30 DIAGNOSIS — E782 Mixed hyperlipidemia: Secondary | ICD-10-CM | POA: Diagnosis not present

## 2019-01-30 DIAGNOSIS — Z0001 Encounter for general adult medical examination with abnormal findings: Secondary | ICD-10-CM | POA: Diagnosis not present

## 2019-01-30 DIAGNOSIS — K5730 Diverticulosis of large intestine without perforation or abscess without bleeding: Secondary | ICD-10-CM | POA: Diagnosis not present

## 2019-01-30 DIAGNOSIS — Z6824 Body mass index (BMI) 24.0-24.9, adult: Secondary | ICD-10-CM | POA: Diagnosis not present

## 2019-02-10 DIAGNOSIS — K5792 Diverticulitis of intestine, part unspecified, without perforation or abscess without bleeding: Secondary | ICD-10-CM | POA: Diagnosis not present

## 2019-03-23 DIAGNOSIS — R109 Unspecified abdominal pain: Secondary | ICD-10-CM | POA: Diagnosis not present

## 2019-03-23 DIAGNOSIS — R112 Nausea with vomiting, unspecified: Secondary | ICD-10-CM | POA: Diagnosis not present

## 2019-03-23 DIAGNOSIS — Z20828 Contact with and (suspected) exposure to other viral communicable diseases: Secondary | ICD-10-CM | POA: Diagnosis not present

## 2019-03-23 DIAGNOSIS — R110 Nausea: Secondary | ICD-10-CM | POA: Diagnosis not present

## 2019-03-23 DIAGNOSIS — Z79899 Other long term (current) drug therapy: Secondary | ICD-10-CM | POA: Diagnosis not present

## 2019-03-23 DIAGNOSIS — E039 Hypothyroidism, unspecified: Secondary | ICD-10-CM | POA: Diagnosis not present

## 2019-03-23 DIAGNOSIS — E860 Dehydration: Secondary | ICD-10-CM | POA: Diagnosis not present

## 2019-03-24 DIAGNOSIS — R0989 Other specified symptoms and signs involving the circulatory and respiratory systems: Secondary | ICD-10-CM | POA: Diagnosis not present

## 2019-03-24 DIAGNOSIS — R05 Cough: Secondary | ICD-10-CM | POA: Diagnosis not present

## 2019-03-29 DIAGNOSIS — R0902 Hypoxemia: Secondary | ICD-10-CM | POA: Diagnosis not present

## 2019-03-29 DIAGNOSIS — F419 Anxiety disorder, unspecified: Secondary | ICD-10-CM | POA: Diagnosis not present

## 2019-03-29 DIAGNOSIS — U071 COVID-19: Secondary | ICD-10-CM | POA: Diagnosis not present

## 2019-03-29 DIAGNOSIS — J1289 Other viral pneumonia: Secondary | ICD-10-CM | POA: Diagnosis not present

## 2019-03-29 DIAGNOSIS — R05 Cough: Secondary | ICD-10-CM | POA: Diagnosis not present

## 2019-03-29 DIAGNOSIS — K760 Fatty (change of) liver, not elsewhere classified: Secondary | ICD-10-CM | POA: Diagnosis not present

## 2019-03-29 DIAGNOSIS — J9601 Acute respiratory failure with hypoxia: Secondary | ICD-10-CM | POA: Diagnosis not present

## 2019-03-29 DIAGNOSIS — Z79899 Other long term (current) drug therapy: Secondary | ICD-10-CM | POA: Diagnosis not present

## 2019-03-29 DIAGNOSIS — E871 Hypo-osmolality and hyponatremia: Secondary | ICD-10-CM | POA: Diagnosis not present

## 2019-03-29 DIAGNOSIS — Z882 Allergy status to sulfonamides status: Secondary | ICD-10-CM | POA: Diagnosis not present

## 2019-03-29 DIAGNOSIS — E039 Hypothyroidism, unspecified: Secondary | ICD-10-CM | POA: Diagnosis not present

## 2019-03-29 DIAGNOSIS — F339 Major depressive disorder, recurrent, unspecified: Secondary | ICD-10-CM | POA: Diagnosis not present

## 2019-03-29 DIAGNOSIS — R110 Nausea: Secondary | ICD-10-CM | POA: Diagnosis not present

## 2019-03-29 DIAGNOSIS — R109 Unspecified abdominal pain: Secondary | ICD-10-CM | POA: Diagnosis not present

## 2019-04-05 DIAGNOSIS — Z79899 Other long term (current) drug therapy: Secondary | ICD-10-CM | POA: Diagnosis not present

## 2019-04-05 DIAGNOSIS — U071 COVID-19: Secondary | ICD-10-CM | POA: Diagnosis not present

## 2019-04-05 DIAGNOSIS — E039 Hypothyroidism, unspecified: Secondary | ICD-10-CM | POA: Diagnosis not present

## 2019-05-05 DIAGNOSIS — J1289 Other viral pneumonia: Secondary | ICD-10-CM | POA: Diagnosis not present

## 2019-05-05 DIAGNOSIS — U071 COVID-19: Secondary | ICD-10-CM | POA: Diagnosis not present

## 2019-06-18 DIAGNOSIS — L639 Alopecia areata, unspecified: Secondary | ICD-10-CM | POA: Diagnosis not present

## 2019-07-11 ENCOUNTER — Ambulatory Visit: Payer: Medicare HMO | Attending: Internal Medicine

## 2019-07-11 DIAGNOSIS — Z23 Encounter for immunization: Secondary | ICD-10-CM | POA: Insufficient documentation

## 2019-07-11 NOTE — Progress Notes (Signed)
   Covid-19 Vaccination Clinic  Name:  PASHEN JAMBOR    MRN: [id number] DOB: 03-03-1946  07/11/2019  Ms. Bieschke was observed post Covid-19 immunization for 15 minutes without incident. She was provided with Vaccine Information Sheet and instruction to access the V-Safe system.   Ms. Vater was instructed to call 911 with any severe reactions post vaccine: Marland Kitchen Difficulty breathing  . Swelling of face and throat  . A fast heartbeat  . A bad rash all over body  . Dizziness and weakness   Immunizations Administered    Name Date Dose VIS Date Route   Moderna COVID-19 Vaccine 07/11/2019  9:05 AM 0.5 mL 04/08/2019 Intramuscular   Manufacturer: Moderna   LotQN:[id number]   [place]VO:[id number]

## 2019-07-23 DIAGNOSIS — E782 Mixed hyperlipidemia: Secondary | ICD-10-CM | POA: Diagnosis not present

## 2019-07-23 DIAGNOSIS — E039 Hypothyroidism, unspecified: Secondary | ICD-10-CM | POA: Diagnosis not present

## 2019-07-23 DIAGNOSIS — K219 Gastro-esophageal reflux disease without esophagitis: Secondary | ICD-10-CM | POA: Diagnosis not present

## 2019-07-23 DIAGNOSIS — R5383 Other fatigue: Secondary | ICD-10-CM | POA: Diagnosis not present

## 2019-07-31 DIAGNOSIS — E039 Hypothyroidism, unspecified: Secondary | ICD-10-CM | POA: Diagnosis not present

## 2019-07-31 DIAGNOSIS — Z6824 Body mass index (BMI) 24.0-24.9, adult: Secondary | ICD-10-CM | POA: Diagnosis not present

## 2019-07-31 DIAGNOSIS — K7581 Nonalcoholic steatohepatitis (NASH): Secondary | ICD-10-CM | POA: Diagnosis not present

## 2019-07-31 DIAGNOSIS — K5730 Diverticulosis of large intestine without perforation or abscess without bleeding: Secondary | ICD-10-CM | POA: Diagnosis not present

## 2019-07-31 DIAGNOSIS — E782 Mixed hyperlipidemia: Secondary | ICD-10-CM | POA: Diagnosis not present

## 2019-08-13 ENCOUNTER — Ambulatory Visit: Payer: Medicare HMO | Attending: Internal Medicine

## 2019-08-13 DIAGNOSIS — Z23 Encounter for immunization: Secondary | ICD-10-CM

## 2019-08-13 NOTE — Progress Notes (Signed)
   Covid-19 Vaccination Clinic  Name:  Desiree Flores    MRN: [id number] DOB: May 10, 1945  08/13/2019  Desiree Flores was observed post Covid-19 immunization for 15 minutes without incident. She was provided with Vaccine Information Sheet and instruction to access the V-Safe system.   Desiree Flores was instructed to call 911 with any severe reactions post vaccine: Marland Kitchen Difficulty breathing  . Swelling of face and throat  . A fast heartbeat  . A bad rash all over body  . Dizziness and weakness   Immunizations Administered    Name Date Dose VIS Date Route   Moderna COVID-19 Vaccine 08/13/2019  8:39 AM 0.5 mL 04/08/2019 Intramuscular   Manufacturer: Levan Hurst   LotUD:[id number]   [place]BE:[id number]

## 2019-08-26 DIAGNOSIS — K760 Fatty (change of) liver, not elsewhere classified: Secondary | ICD-10-CM | POA: Diagnosis not present

## 2019-08-26 DIAGNOSIS — K219 Gastro-esophageal reflux disease without esophagitis: Secondary | ICD-10-CM | POA: Diagnosis not present

## 2019-09-17 DIAGNOSIS — L821 Other seborrheic keratosis: Secondary | ICD-10-CM | POA: Diagnosis not present

## 2019-09-17 DIAGNOSIS — L590 Erythema ab igne [dermatitis ab igne]: Secondary | ICD-10-CM | POA: Diagnosis not present

## 2019-09-18 DIAGNOSIS — R1030 Lower abdominal pain, unspecified: Secondary | ICD-10-CM | POA: Diagnosis not present

## 2019-09-18 DIAGNOSIS — K5792 Diverticulitis of intestine, part unspecified, without perforation or abscess without bleeding: Secondary | ICD-10-CM | POA: Diagnosis not present

## 2019-10-14 DIAGNOSIS — D2262 Melanocytic nevi of left upper limb, including shoulder: Secondary | ICD-10-CM | POA: Diagnosis not present

## 2019-10-14 DIAGNOSIS — L308 Other specified dermatitis: Secondary | ICD-10-CM | POA: Diagnosis not present

## 2019-10-14 DIAGNOSIS — D239 Other benign neoplasm of skin, unspecified: Secondary | ICD-10-CM | POA: Diagnosis not present

## 2019-10-14 DIAGNOSIS — D225 Melanocytic nevi of trunk: Secondary | ICD-10-CM | POA: Diagnosis not present

## 2019-10-14 DIAGNOSIS — L570 Actinic keratosis: Secondary | ICD-10-CM | POA: Diagnosis not present

## 2019-10-14 DIAGNOSIS — D485 Neoplasm of uncertain behavior of skin: Secondary | ICD-10-CM | POA: Diagnosis not present

## 2019-11-10 DIAGNOSIS — Z6823 Body mass index (BMI) 23.0-23.9, adult: Secondary | ICD-10-CM | POA: Diagnosis not present

## 2019-11-10 DIAGNOSIS — K589 Irritable bowel syndrome without diarrhea: Secondary | ICD-10-CM | POA: Diagnosis not present

## 2019-12-29 DIAGNOSIS — H6122 Impacted cerumen, left ear: Secondary | ICD-10-CM | POA: Diagnosis not present

## 2019-12-29 DIAGNOSIS — H6092 Unspecified otitis externa, left ear: Secondary | ICD-10-CM | POA: Diagnosis not present

## 2020-01-01 DIAGNOSIS — H9192 Unspecified hearing loss, left ear: Secondary | ICD-10-CM | POA: Diagnosis not present

## 2020-01-01 DIAGNOSIS — H6122 Impacted cerumen, left ear: Secondary | ICD-10-CM | POA: Diagnosis not present

## 2020-01-10 DIAGNOSIS — S60119A Contusion of unspecified thumb with damage to nail, initial encounter: Secondary | ICD-10-CM | POA: Diagnosis not present

## 2020-01-27 DIAGNOSIS — R5383 Other fatigue: Secondary | ICD-10-CM | POA: Diagnosis not present

## 2020-01-27 DIAGNOSIS — E039 Hypothyroidism, unspecified: Secondary | ICD-10-CM | POA: Diagnosis not present

## 2020-01-27 DIAGNOSIS — K219 Gastro-esophageal reflux disease without esophagitis: Secondary | ICD-10-CM | POA: Diagnosis not present

## 2020-01-27 DIAGNOSIS — K7581 Nonalcoholic steatohepatitis (NASH): Secondary | ICD-10-CM | POA: Diagnosis not present

## 2020-01-27 DIAGNOSIS — K760 Fatty (change of) liver, not elsewhere classified: Secondary | ICD-10-CM | POA: Diagnosis not present

## 2020-01-27 DIAGNOSIS — E782 Mixed hyperlipidemia: Secondary | ICD-10-CM | POA: Diagnosis not present

## 2020-02-03 DIAGNOSIS — Z23 Encounter for immunization: Secondary | ICD-10-CM | POA: Diagnosis not present

## 2020-02-03 DIAGNOSIS — K5730 Diverticulosis of large intestine without perforation or abscess without bleeding: Secondary | ICD-10-CM | POA: Diagnosis not present

## 2020-02-03 DIAGNOSIS — Z0001 Encounter for general adult medical examination with abnormal findings: Secondary | ICD-10-CM | POA: Diagnosis not present

## 2020-02-03 DIAGNOSIS — K7581 Nonalcoholic steatohepatitis (NASH): Secondary | ICD-10-CM | POA: Diagnosis not present

## 2020-02-03 DIAGNOSIS — Z6824 Body mass index (BMI) 24.0-24.9, adult: Secondary | ICD-10-CM | POA: Diagnosis not present

## 2020-02-03 DIAGNOSIS — E039 Hypothyroidism, unspecified: Secondary | ICD-10-CM | POA: Diagnosis not present

## 2020-02-03 DIAGNOSIS — E782 Mixed hyperlipidemia: Secondary | ICD-10-CM | POA: Diagnosis not present

## 2020-02-06 DIAGNOSIS — K7581 Nonalcoholic steatohepatitis (NASH): Secondary | ICD-10-CM | POA: Diagnosis not present

## 2020-02-06 DIAGNOSIS — E782 Mixed hyperlipidemia: Secondary | ICD-10-CM | POA: Diagnosis not present

## 2020-02-06 DIAGNOSIS — K5730 Diverticulosis of large intestine without perforation or abscess without bleeding: Secondary | ICD-10-CM | POA: Diagnosis not present

## 2020-02-06 DIAGNOSIS — Z0001 Encounter for general adult medical examination with abnormal findings: Secondary | ICD-10-CM | POA: Diagnosis not present

## 2020-02-06 DIAGNOSIS — E039 Hypothyroidism, unspecified: Secondary | ICD-10-CM | POA: Diagnosis not present

## 2020-03-02 ENCOUNTER — Encounter (INDEPENDENT_AMBULATORY_CARE_PROVIDER_SITE_OTHER): Payer: Self-pay | Admitting: *Deleted

## 2020-04-08 ENCOUNTER — Ambulatory Visit (INDEPENDENT_AMBULATORY_CARE_PROVIDER_SITE_OTHER): Payer: Medicare HMO | Admitting: Gastroenterology

## 2020-05-31 DIAGNOSIS — R300 Dysuria: Secondary | ICD-10-CM | POA: Diagnosis not present

## 2020-05-31 DIAGNOSIS — Z6824 Body mass index (BMI) 24.0-24.9, adult: Secondary | ICD-10-CM | POA: Diagnosis not present

## 2020-06-03 ENCOUNTER — Other Ambulatory Visit: Payer: Self-pay

## 2020-06-03 ENCOUNTER — Encounter (INDEPENDENT_AMBULATORY_CARE_PROVIDER_SITE_OTHER): Payer: Self-pay | Admitting: Internal Medicine

## 2020-06-03 ENCOUNTER — Ambulatory Visit (INDEPENDENT_AMBULATORY_CARE_PROVIDER_SITE_OTHER): Payer: Medicare HMO | Admitting: Internal Medicine

## 2020-06-03 VITALS — BP 124/97 | HR 82 | Temp 99.3°F | Ht 67.0 in | Wt 160.0 lb

## 2020-06-03 DIAGNOSIS — R7401 Elevation of levels of liver transaminase levels: Secondary | ICD-10-CM

## 2020-06-03 DIAGNOSIS — K2100 Gastro-esophageal reflux disease with esophagitis, without bleeding: Secondary | ICD-10-CM | POA: Diagnosis not present

## 2020-06-03 DIAGNOSIS — R1319 Other dysphagia: Secondary | ICD-10-CM

## 2020-06-03 MED ORDER — PANTOPRAZOLE SODIUM 40 MG PO TBEC: 40.0000 mg | DELAYED_RELEASE_TABLET | Freq: Every day | ORAL | 3 refills | Status: DC

## 2020-06-03 MED ORDER — PANTOPRAZOLE SODIUM 40 MG PO TBEC: 40.0000 mg | DELAYED_RELEASE_TABLET | Freq: Every day | ORAL | 0 refills | Status: DC

## 2020-06-03 MED ORDER — CEPHALEXIN 500 MG PO CAPS: 1000.0000 mg | ORAL_CAPSULE | Freq: Two times a day (BID) | ORAL | 0 refills | Status: DC

## 2020-06-03 NOTE — Progress Notes (Signed)
Presenting complaint;  Swallowing difficulty.  Database and subjective:  Patient is 75 year old Caucasian female who has history of erosive reflux esophagitis and distal esophageal ring and or stricture who has undergone dilation in March 2017 and more recently in May 2019. She now presents with 3 to 11-monthhistory of dysphagia primarily to solids.  She has not taken PPI in several months.  She did not realize that she needed to stay on it.  She is having swallowing difficulty intermittently.  It may be every couple of days her few weeks.  She had an episode of food impaction which lasted for 4 hours.  She has heartburn no more than once or twice a month.  She states she is not a slow eater.  She has occasional regurgitation.  She denies abdominal pain nausea or vomiting.  Her appetite is good.  Her bowels move daily.  She denies melena or rectal bleeding. She has history of mildly elevated ALT.  She did have abdominal ultrasound in May 2019 and was within normal limits. Patient says she was begun on cephalexin by her physician for urinary tract infection.   Current Medications: Outpatient Encounter Medications as of 06/03/2020  Medication Sig  . acetaminophen (TYLENOL) 325 MG tablet Take 325-650 mg by mouth every 6 (six) hours as needed for moderate pain or headache.  . Calcium Carbonate-Vitamin D (CALCIUM-VITAMIN D3 PO) Take 1 tablet by mouth daily.  . cephALEXin (KEFLEX) 500 MG capsule Take 1,000 mg by mouth 2 (two) times daily. For seven days.  . Glucosamine HCl (GLUCOSAMINE PO) Take 2 tablets by mouth daily.  .Marland Kitchenlevothyroxine (SYNTHROID, LEVOTHROID) 100 MCG tablet Take 100 mcg by mouth daily before breakfast.  . Multiple Vitamins-Minerals (MULTIVITAMIN PO) Take 1 tablet by mouth daily.  . pantoprazole (PROTONIX) 40 MG tablet Take 1 tablet (40 mg total) by mouth daily. (Patient not taking: No sig reported)   No facility-administered encounter medications on file as of 06/03/2020.   Past  Medical History:  Diagnosis Date  . Diverticulitis   . Hypothyroidism    Past Surgical History:  Procedure Laterality Date  . APPENDECTOMY     6-7 yrs ago  . COLONOSCOPY    . COLONOSCOPY N/A 01/28/2014   Procedure: COLONOSCOPY;  Surgeon: NRogene Houston MD;  Location: [facility];  Service: Endoscopy;  Laterality: N/A;  200  . DILATION AND CURETTAGE OF UTERUS     miscarriage  . ESOPHAGEAL DILATION  08/03/2015   Procedure: ESOPHAGEAL DILATION;  Surgeon: NRogene Houston MD;  Location: [facility];  Service: Endoscopy;;  . ESOPHAGEAL DILATION N/A 09/19/2017   Procedure: ESOPHAGEAL DILATION;  Surgeon: RRogene Houston MD;  [facility]: [facility];  Service: Endoscopy;  Laterality: N/A;  . ESOPHAGOGASTRODUODENOSCOPY N/A 08/03/2015   Procedure: ESOPHAGOGASTRODUODENOSCOPY (EGD);  Surgeon: NRogene Houston MD;  [facility]: [facility];  Service: Endoscopy;  Laterality: N/A;  . ESOPHAGOGASTRODUODENOSCOPY N/A 09/19/2017   Procedure: ESOPHAGOGASTRODUODENOSCOPY (EGD);  Surgeon: RRogene Houston MD;  Location: [facility];  Service: Endoscopy;  Laterality: N/A;  12:40-rescheduled to 5/15 @ 7:30am per ALelon Frohlich     Objective: Blood pressure (!) 124/97, pulse 82, temperature 99.3 F (37.4 C), temperature source Oral, height 5' 7"  (1.702 m), weight 160 lb (72.6 kg). Patient is alert and in no acute distress. She is wearing a mask. Conjunctiva is pink. Sclera is nonicteric Oropharyngeal mucosa is normal. No neck masses or thyromegaly noted. Cardiac exam with regular rhythm normal S1 and S2. No  murmur or gallop noted. Lungs are clear to auscultation. Abdomen is symmetrical soft and nontender with organomegaly or masses. No LE edema or clubbing noted.  Labs/studies Results:  CBC Latest Ref Rng & Units 08/04/2015 08/03/2015 08/02/2015  WBC 4.0 - 10.5 K/uL 5.9 5.6 6.3  Hemoglobin 12.0 - 15.0 g/dL 13.0 12.1 12.2  Hematocrit 36.0 - 46.0 % 37.2 36.4 36.2  Platelets 150 - 400 K/uL 160 144(L) 138(L)     CMP Latest Ref Rng & Units 05/30/2016 12/22/2015 11/19/2015  Glucose 65 - 99 mg/dL - - -  BUN 6 - 20 mg/dL - - -  Creatinine 0.44 - 1.00 mg/dL - - -  Sodium 135 - 145 mmol/L - - -  Potassium 3.5 - 5.1 mmol/L - - -  Chloride 101 - 111 mmol/L - - -  CO2 22 - 32 mmol/L - - -  Calcium 8.9 - 10.3 mg/dL - - -  Total Protein 6.1 - 8.1 g/dL 7.2 7.1 6.9  Total Bilirubin 0.2 - 1.2 mg/dL 0.5 0.4 0.5  Alkaline Phos 33 - 130 U/L 77 76 71  AST 10 - 35 U/L 26 28 25   ALT 6 - 29 U/L 30(H) 40(H) 33(H)    Hepatic Function Latest Ref Rng & Units 05/30/2016 12/22/2015 11/19/2015  Total Protein 6.1 - 8.1 g/dL 7.2 7.1 6.9  Albumin 3.6 - 5.1 g/dL 4.0 4.2 4.2  AST 10 - 35 U/L 26 28 25   ALT 6 - 29 U/L 30(H) 40(H) 33(H)  Alk Phosphatase 33 - 130 U/L 77 76 71  Total Bilirubin 0.2 - 1.2 mg/dL 0.5 0.4 0.5  Bilirubin, Direct <=0.2 mg/dL 0.1 0.1 0.1      Assessment:  #1.  Esophageal dysphagia.  She has a history of Schatzki's ring and has undergone dilation twice in the past most recently in May 2019.  I wonder if she has developed stricture given history of erosive reflux esophagitis and no acid suppression.  #2.  Chronic GERD.  She has history of erosive reflux esophagitis.  She is having heartburn no more than once or twice a month.  She may be silent reflux or.  She needs to go back on PPI.  #3.  History of mildly elevated ALT.  Ultrasound in May 2019 was within normal limits.  We will repeat LFTs.  If ALT is elevated would consider further work-up.  #4.  Patient is average risk for CRC.  Last colonoscopy was in September 2015 revealing sigmoid diverticulosis but no polyps.  She is therefore up-to-date and does not need any more screening exam.   Plan:  Pantoprazole 40 mg p.o. every morning. Please note patient is on cephalexin for urinary tract infection medication was accidentally deleted and added to the list of medications. Patient will go to lab for LFTs. Esophagogastroduodenoscopy with esophageal  dilation to be scheduled.

## 2020-06-03 NOTE — Patient Instructions (Signed)
Physician will call with results of LFTs when completed. 

## 2020-06-07 ENCOUNTER — Telehealth (INDEPENDENT_AMBULATORY_CARE_PROVIDER_SITE_OTHER): Payer: Self-pay | Admitting: Internal Medicine

## 2020-06-07 NOTE — Telephone Encounter (Signed)
Patient aware the medication has been sent to [facility].

## 2020-06-07 NOTE — Telephone Encounter (Signed)
Patient left voice mail message stating Dr Laural Golden was to send a prescription for protonix - she thought is was to be sent to [facility] - please advise - ph# [phone]

## 2020-06-07 NOTE — Telephone Encounter (Signed)
Tried calling patient twice. Someone picked up the phone both times and hung up. The medication was sent in on 06/03/2020 to [facility].

## 2020-06-08 ENCOUNTER — Encounter (INDEPENDENT_AMBULATORY_CARE_PROVIDER_SITE_OTHER): Payer: Self-pay

## 2020-06-08 ENCOUNTER — Ambulatory Visit (INDEPENDENT_AMBULATORY_CARE_PROVIDER_SITE_OTHER): Payer: Medicare HMO | Admitting: Internal Medicine

## 2020-06-16 ENCOUNTER — Telehealth (INDEPENDENT_AMBULATORY_CARE_PROVIDER_SITE_OTHER): Payer: Self-pay | Admitting: *Deleted

## 2020-06-16 NOTE — Telephone Encounter (Signed)
There has been a mistake from [facility]. They have been sending [place] refills for Cepalexin and Dr.Rehman states that she would need to go to her PCP for this medication as when she was in the office on 06/03/2020 she was taking this for a UTI.  When I talked with the patient she states that she never got the Pantoprazole 40 mg. Dr.Rehman did sent this electronically to [facility], the patient states that she never got this prescription. She ask that we [facility] [place].  [facility] for the following: Pantoprazole 40 mg - take 1 by mouth 30 minutes before breakfast. #90 with 3 refills. If Insurance will not allow - may fill Pantoprazole 40 mg - take 1 by mouth 30 minutes before breakfast. #30 11 refills.  The patient is aware that this  Has been done. She did not need the Cephalexin at all.

## 2020-06-18 DIAGNOSIS — R7401 Elevation of levels of liver transaminase levels: Secondary | ICD-10-CM | POA: Diagnosis not present

## 2020-06-18 LAB — HEPATIC FUNCTION PANEL
AG Ratio: 1.6 (calc) (ref 1.0–2.5)
ALT: 30 U/L — ABNORMAL HIGH (ref 6–29)
AST: 24 U/L (ref 10–35)
Albumin: 4.5 g/dL (ref 3.6–5.1)
Alkaline phosphatase (APISO): 65 U/L (ref 37–153)
Bilirubin, Direct: 0.1 mg/dL (ref 0.0–0.2)
Globulin: 2.8 g/dL (calc) (ref 1.9–3.7)
Indirect Bilirubin: 0.5 mg/dL (calc) (ref 0.2–1.2)
Total Bilirubin: 0.6 mg/dL (ref 0.2–1.2)
Total Protein: 7.3 g/dL (ref 6.1–8.1)

## 2020-06-24 ENCOUNTER — Other Ambulatory Visit (INDEPENDENT_AMBULATORY_CARE_PROVIDER_SITE_OTHER): Payer: Self-pay | Admitting: *Deleted

## 2020-06-24 DIAGNOSIS — R7401 Elevation of levels of liver transaminase levels: Secondary | ICD-10-CM

## 2020-07-05 ENCOUNTER — Telehealth (INDEPENDENT_AMBULATORY_CARE_PROVIDER_SITE_OTHER): Payer: Self-pay | Admitting: Internal Medicine

## 2020-07-05 NOTE — Telephone Encounter (Signed)
Patient left voice mail message stating she would like to get scheduled for an EGD that Dr Laural Golden had talked to her about having - please advise - ph# [phone]

## 2020-07-06 ENCOUNTER — Encounter (INDEPENDENT_AMBULATORY_CARE_PROVIDER_SITE_OTHER): Payer: Self-pay

## 2020-07-06 ENCOUNTER — Other Ambulatory Visit (INDEPENDENT_AMBULATORY_CARE_PROVIDER_SITE_OTHER): Payer: Self-pay

## 2020-07-06 DIAGNOSIS — R1319 Other dysphagia: Secondary | ICD-10-CM

## 2020-07-06 NOTE — Telephone Encounter (Signed)
noted 

## 2020-07-12 ENCOUNTER — Encounter (HOSPITAL_COMMUNITY): Payer: Self-pay | Admitting: Internal Medicine

## 2020-07-14 ENCOUNTER — Other Ambulatory Visit (HOSPITAL_COMMUNITY)
Admission: RE | Admit: 2020-07-14 | Discharge: 2020-07-14 | Disposition: A | Discharge status: Home / Self Care | Payer: Medicare HMO | Source: Ambulatory Visit | Attending: Family Medicine | Admitting: Family Medicine

## 2020-07-14 ENCOUNTER — Other Ambulatory Visit: Payer: Self-pay

## 2020-07-14 DIAGNOSIS — Z01812 Encounter for preprocedural laboratory examination: Secondary | ICD-10-CM | POA: Insufficient documentation

## 2020-07-14 DIAGNOSIS — Z20822 Contact with and (suspected) exposure to covid-19: Secondary | ICD-10-CM | POA: Insufficient documentation

## 2020-07-14 DIAGNOSIS — R1319 Other dysphagia: Secondary | ICD-10-CM | POA: Diagnosis not present

## 2020-07-14 LAB — SARS CORONAVIRUS 2 (TAT 6-24 HRS): SARS Coronavirus 2: NEGATIVE

## 2020-07-15 ENCOUNTER — Encounter (HOSPITAL_COMMUNITY)
Admission: RE | Disposition: A | Discharge status: Home / Self Care | Payer: Self-pay | Source: Home / Self Care | Attending: Internal Medicine

## 2020-07-15 ENCOUNTER — Encounter (HOSPITAL_COMMUNITY): Payer: Self-pay | Admitting: Internal Medicine

## 2020-07-15 ENCOUNTER — Other Ambulatory Visit: Payer: Self-pay

## 2020-07-15 ENCOUNTER — Ambulatory Visit (HOSPITAL_COMMUNITY)
Admission: RE | Admit: 2020-07-15 | Discharge: 2020-07-15 | Disposition: A | Discharge status: Home / Self Care | Payer: Medicare HMO | Attending: Internal Medicine | Admitting: Internal Medicine

## 2020-07-15 DIAGNOSIS — R1314 Dysphagia, pharyngoesophageal phase: Secondary | ICD-10-CM | POA: Insufficient documentation

## 2020-07-15 DIAGNOSIS — Z79899 Other long term (current) drug therapy: Secondary | ICD-10-CM | POA: Diagnosis not present

## 2020-07-15 DIAGNOSIS — R1319 Other dysphagia: Secondary | ICD-10-CM

## 2020-07-15 DIAGNOSIS — Z79890 Hormone replacement therapy (postmenopausal): Secondary | ICD-10-CM | POA: Diagnosis not present

## 2020-07-15 DIAGNOSIS — K449 Diaphragmatic hernia without obstruction or gangrene: Secondary | ICD-10-CM | POA: Diagnosis not present

## 2020-07-15 DIAGNOSIS — Z882 Allergy status to sulfonamides status: Secondary | ICD-10-CM | POA: Diagnosis not present

## 2020-07-15 DIAGNOSIS — K222 Esophageal obstruction: Secondary | ICD-10-CM | POA: Insufficient documentation

## 2020-07-15 HISTORY — PX: ESOPHAGEAL DILATION: SHX303

## 2020-07-15 HISTORY — PX: ESOPHAGOGASTRODUODENOSCOPY: SHX5428

## 2020-07-15 SURGERY — EGD (ESOPHAGOGASTRODUODENOSCOPY)
Anesthesia: Moderate Sedation

## 2020-07-15 MED ORDER — STERILE WATER FOR IRRIGATION IR SOLN
Status: DC | PRN
  Administered 2020-07-15: 100 mL

## 2020-07-15 MED ORDER — LIDOCAINE VISCOUS HCL 2 % MT SOLN
OROMUCOSAL | Status: DC | PRN
  Administered 2020-07-15: 1 via OROMUCOSAL

## 2020-07-15 MED ORDER — SODIUM CHLORIDE 0.9 % IV SOLN: INTRAVENOUS | Status: DC

## 2020-07-15 MED ORDER — MEPERIDINE HCL 50 MG/ML IJ SOLN
INTRAMUSCULAR | Status: DC | PRN
  Administered 2020-07-15 (×2): 25 mg

## 2020-07-15 MED ORDER — MIDAZOLAM HCL 5 MG/5ML IJ SOLN
INTRAMUSCULAR | Status: AC
  Filled 2020-07-15: qty 10

## 2020-07-15 MED ORDER — MIDAZOLAM HCL 5 MG/5ML IJ SOLN
INTRAMUSCULAR | Status: DC | PRN
  Administered 2020-07-15 (×3): 2 mg via INTRAVENOUS

## 2020-07-15 MED ORDER — MEPERIDINE HCL 50 MG/ML IJ SOLN
INTRAMUSCULAR | Status: AC
  Filled 2020-07-15: qty 1

## 2020-07-15 MED ORDER — LIDOCAINE VISCOUS HCL 2 % MT SOLN
OROMUCOSAL | Status: AC
  Filled 2020-07-15: qty 15

## 2020-07-15 NOTE — Discharge Instructions (Signed)
No aspirin or NSAIDs for 3 days Resume usual medications and diet as before Decrease pantoprazole to 40 mg every other day after 3 months. No driving for 24 hours. Office visit in 6 months.  Office will call you.   Hiatal Hernia  A hiatal hernia occurs when part of the stomach slides above the muscle that separates the abdomen from the chest (diaphragm). A person can be born with a hiatal hernia (congenital), or it may develop over time. In almost all cases of hiatal hernia, only the top part of the stomach pushes through the diaphragm. Many people have a hiatal hernia with no symptoms. The larger the hernia, the more likely it is that you will have symptoms. In some cases, a hiatal hernia allows stomach acid to flow back into the tube that carries food from your mouth to your stomach (esophagus). This may cause heartburn symptoms. Severe heartburn symptoms may mean that you have developed a condition called gastroesophageal reflux disease (GERD). What are the causes? This condition is caused by a weakness in the opening (hiatus) where the esophagus passes through the diaphragm to attach to the upper part of the stomach. A person may be born with a weakness in the hiatus, or a weakness can develop over time. What increases the risk? This condition is more likely to develop in:  Older people. Age is a major risk factor for a hiatal hernia, especially if you are over the age of 9.  Pregnant women.  People who are overweight.  People who have frequent constipation. What are the signs or symptoms? Symptoms of this condition usually develop in the form of GERD symptoms. Symptoms include:  Heartburn.  Belching.  Indigestion.  Trouble swallowing.  Coughing or wheezing.  Sore throat.  Hoarseness.  Chest pain.  Nausea and vomiting. How is this diagnosed? This condition may be diagnosed during testing for GERD. Tests that may be done include:  X-rays of your stomach or  chest.  An upper gastrointestinal (GI) series. This is an X-ray exam of your GI tract that is taken after you swallow a chalky liquid that shows up clearly on the X-ray.  Endoscopy. This is a procedure to look into your stomach using a thin, flexible tube that has a tiny camera and light on the end of it. How is this treated? This condition may be treated by:  Dietary and lifestyle changes to help reduce GERD symptoms.  Medicines. These may include: ? Over-the-counter antacids. ? Medicines that make your stomach empty more quickly. ? Medicines that block the production of stomach acid (H2 blockers). ? Stronger medicines to reduce stomach acid (proton pump inhibitors).  Surgery to repair the hernia, if other treatments are not helping. If you have no symptoms, you may not need treatment. Follow these instructions at home: Lifestyle and activity  Do not use any products that contain nicotine or tobacco, such as cigarettes and e-cigarettes. If you need help quitting, ask your health care provider.  Try to achieve and maintain a healthy body weight.  Avoid putting pressure on your abdomen. Anything that puts pressure on your abdomen increases the amount of acid that may be pushed up into your esophagus. ? Avoid bending over, especially after eating. ? Raise the head of your bed by putting blocks under the legs. This keeps your head and esophagus higher than your stomach. ? Do not wear tight clothing around your chest or stomach. ? Try not to strain when having a bowel movement, when  urinating, or when lifting heavy objects. Eating and drinking  Avoid foods that can worsen GERD symptoms. These may include: ? Fatty foods, like fried foods. ? Citrus fruits, like oranges or lemon. ? Other foods and drinks that contain acid, like orange juice or tomatoes. ? Spicy food. ? Chocolate.  Eat frequent small meals instead of three large meals a day. This helps prevent your stomach from getting  too full. ? Eat slowly. ? Do not lie down right after eating. ? Do not eat 1-2 hours before bed.  Do not drink beverages with caffeine. These include cola, coffee, cocoa, and tea.  Do not drink alcohol. General instructions  Take over-the-counter and prescription medicines only as told by your health care provider.  Keep all follow-up visits as told by your health care provider. This is important. Contact a health care provider if:  Your symptoms are not controlled with medicines or lifestyle changes.  You are having trouble swallowing.  You have coughing or wheezing that will not go away. Get help right away if:  Your pain is getting worse.  Your pain spreads to your arms, neck, jaw, teeth, or back.  You have shortness of breath.  You sweat for no reason.  You feel sick to your stomach (nauseous) or you vomit.  You vomit blood.  You have bright red blood in your stools.  You have black, tarry stools. This information is not intended to replace advice given to you by your health care provider. Make sure you discuss any questions you have with your health care provider. Document Revised: 04/06/2017 Document Reviewed: 11/27/2016 Elsevier Patient Education  2021 [place].  Upper Endoscopy, Adult, Care After This sheet gives you information about how to care for yourself after your procedure. Your health care provider may also give you more specific instructions. If you have problems or questions, contact your health care provider. What can I expect after the procedure? After the procedure, it is common to have:  A sore throat.  Mild stomach pain or discomfort.  Bloating.  Nausea. Follow these instructions at home:  Follow instructions from your health care provider about what to eat or drink after your procedure.  Return to your normal activities as told by your health care provider. Ask your health care provider what activities are safe for you.  Take  over-the-counter and prescription medicines only as told by your health care provider.  If you were given a sedative during the procedure, it can affect you for several hours. Do not drive or operate machinery until your health care provider says that it is safe.  Keep all follow-up visits as told by your health care provider. This is important.   Contact a health care provider if you have:  A sore throat that lasts longer than one day.  Trouble swallowing. Get help right away if:  You vomit blood or your vomit looks like coffee grounds.  You have: ? A fever. ? Bloody, black, or tarry stools. ? A severe sore throat or you cannot swallow. ? Difficulty breathing. ? Severe pain in your chest or abdomen. Summary  After the procedure, it is common to have a sore throat, mild stomach discomfort, bloating, and nausea.  If you were given a sedative during the procedure, it can affect you for several hours. Do not drive or operate machinery until your health care provider says that it is safe.  Follow instructions from your health care provider about what to eat or drink  after your procedure.  Return to your normal activities as told by your health care provider. This information is not intended to replace advice given to you by your health care provider. Make sure you discuss any questions you have with your health care provider. Document Revised: 04/22/2019 Document Reviewed: 09/24/2017 Elsevier Patient Education  2021 [employer].

## 2020-07-15 NOTE — Op Note (Signed)
[facility] Patient Name: Desiree Flores Procedure Date: 07/15/2020 9:16 AM MRN: [id number] Date of Birth: [birth year] Attending MD: Hildred Laser , MD CSN: [id number] Age: 75 Admit Type: Outpatient Procedure:                Upper GI endoscopy Indications:              Esophageal dysphagia Providers:                Hildred Laser, MD, Janeece Riggers, RN, Lambert Mody Referring MD:             Mitzie Na. Quillian Quince, MD Medicines:                Lidocaine spray, Meperidine 50 mg IV, Midazolam 6                            mg IV Complications:            No immediate complications. Estimated Blood Loss:     Estimated blood loss was minimal. Procedure:                Pre-Anesthesia Assessment:                           - Prior to the procedure, a History and Physical                            was performed, and patient medications and                            allergies were reviewed. The patient's tolerance of                            previous anesthesia was also reviewed. The risks                            and benefits of the procedure and the sedation                            options and risks were discussed with the patient.                            All questions were answered, and informed consent                            was obtained. Prior Anticoagulants: The patient has                            taken no previous anticoagulant or antiplatelet                            agents. ASA Grade Assessment: II - A patient with  mild systemic disease. After reviewing the risks                            and benefits, the patient was deemed in                            satisfactory condition to undergo the procedure.                           After obtaining informed consent, the endoscope was                            passed under direct vision. Throughout the                            procedure, the patient's blood  pressure, pulse, and                            oxygen saturations were monitored continuously. The                            GIF-H190 ([id number]) scope was introduced through the                            mouth, and advanced to the second part of duodenum.                            The upper GI endoscopy was accomplished without                            difficulty. The patient tolerated the procedure                            well. Scope In: 9:37:40 AM Scope Out: 9:45:07 AM Total Procedure Duration: 0 hours 7 minutes 27 seconds  Findings:      The hypopharynx was normal.      The examined esophagus was normal.      One benign-appearing, intrinsic moderate stenosis was found 40 cm from       the incisors. The stenosis was traversed. A TTS dilator was passed       through the scope. Dilation with a 15-16.5-18 mm balloon dilator was       performed to 15 mm, 16.5 mm and 18 mm. The dilation site was examined       and showed mild mucosal disruption, mild improvement in luminal       narrowing and no perforation.      A 2 cm hiatal hernia was present.      The entire examined stomach was normal.      The duodenal bulb and second portion of the duodenum were normal. Impression:               - Normal hypopharynx.                           - Normal esophagus.                           -  Benign-appearing esophageal stenosis. Dilated.                           - 2 cm hiatal hernia.                           - Normal stomach.                           - Normal duodenal bulb and second portion of the                            duodenum.                           - No specimens collected. Moderate Sedation:      Moderate (conscious) sedation was administered by the endoscopy nurse       and supervised by the endoscopist. The following parameters were       monitored: oxygen saturation, heart rate, blood pressure, CO2       capnography and response to care. Total physician intraservice time  was       12 minutes. Recommendation:           - Patient has a contact number available for                            emergencies. The signs and symptoms of potential                            delayed complications were discussed with the                            patient. Return to normal activities tomorrow.                            Written discharge instructions were provided to the                            patient.                           - Resume previous diet today.                           - Continue present medications.                           - No aspirin, ibuprofen, naproxen, or other                            non-steroidal anti-inflammatory drugs for 3 days.                           - Return to GI clinic in 6 months. Procedure Code(s):        --- Professional ---  [phone], Esophagogastroduodenoscopy, flexible,                            transoral; with transendoscopic balloon dilation of                            esophagus (less than 30 mm diameter)                           G0500, Moderate sedation services provided by the                            same physician or other qualified health care                            professional performing a gastrointestinal                            endoscopic service that sedation supports,                            requiring the presence of an independent trained                            observer to assist in the monitoring of the                            patient's level of consciousness and physiological                            status; initial 15 minutes of intra-service time;                            patient age 93 years or older (additional time may                            be reported with [phone], as appropriate) Diagnosis Code(s):        --- Professional ---                           K22.2, Esophageal obstruction                           K44.9, Diaphragmatic hernia without  obstruction or                            gangrene                           R13.14, Dysphagia, pharyngoesophageal phase CPT copyright 2019 American Medical Association. All rights reserved. The codes documented in this report are preliminary and upon coder review may  be revised to meet current compliance requirements. Hildred Laser, MD Hildred Laser, MD 07/15/2020 9:57:10 AM This report has been signed electronically. Number of Addenda: 0

## 2020-07-15 NOTE — H&P (Signed)
Desiree Flores is an 75 y.o. female.   Chief Complaint: Patient is here for esophagogastroduodenoscopy and esophageal dilation HPI: Patient is 75 year old Caucasian female was history of erosive reflux esophagitis and distal esophageal ring/stricture dilated in March 2017 and again in May 2019 who presents with 3 to 73-month history of dysphagia primarily to solids.  Patient did not take her PPI in several months.  Patient was asked to go back on pantoprazole.  She says she has been chewing her food good.  She has not had heartburn anymore.  She also has not had any swallowing difficulty since then.  She denies abdominal pain or melena.  She is not having any side effects with PPI.  Past Medical History:  Diagnosis Date  . Diverticulitis   . Hypothyroidism          3 of erosive reflux esophagitis  Past Surgical History:  Procedure Laterality Date  . APPENDECTOMY     6-7 yrs ago  . COLONOSCOPY    . COLONOSCOPY N/A 01/28/2014   Procedure: COLONOSCOPY;  Surgeon: Rogene Houston, MD;  Location: [facility];  Service: Endoscopy;  Laterality: N/A;  200  . DILATION AND CURETTAGE OF UTERUS     miscarriage  . ESOPHAGEAL DILATION  08/03/2015   Procedure: ESOPHAGEAL DILATION;  Surgeon: Rogene Houston, MD;  Location: [facility];  Service: Endoscopy;;  . ESOPHAGEAL DILATION N/A 09/19/2017   Procedure: ESOPHAGEAL DILATION;  Surgeon: Rogene Houston, MD;  Location: [facility];  Service: Endoscopy;  Laterality: N/A;  . ESOPHAGOGASTRODUODENOSCOPY N/A 08/03/2015   Procedure: ESOPHAGOGASTRODUODENOSCOPY (EGD);  Surgeon: Rogene Houston, MD;  Location: [facility];  Service: Endoscopy;  Laterality: N/A;  . ESOPHAGOGASTRODUODENOSCOPY N/A 09/19/2017   Procedure: ESOPHAGOGASTRODUODENOSCOPY (EGD);  Surgeon: Rogene Houston, MD;  Location: [facility];  Service: Endoscopy;  Laterality: N/A;  12:40-rescheduled to 5/15 @ 7:30am per Lelon Frohlich    Family History  Problem Relation Age of Onset  . Colon cancer  Other    Social History:  reports that she has never smoked. She has never used smokeless tobacco. She reports that she does not drink alcohol and does not use drugs.  Allergies:  Allergies  Allergen Reactions  . Sulfa Antibiotics Other (See Comments)    Yeast infection    Medications Prior to Admission  Medication Sig Dispense Refill  . acetaminophen (TYLENOL) 325 MG tablet Take 325-650 mg by mouth every 6 (six) hours as needed for moderate pain or headache.    . Calcium Carbonate-Vitamin D (CALCIUM-VITAMIN D3 PO) Take 1 tablet by mouth daily in the afternoon.    . Glucosamine HCl (GLUCOSAMINE PO) Take 2 tablets by mouth daily in the afternoon.    Marland Kitchen levothyroxine (SYNTHROID, LEVOTHROID) 100 MCG tablet Take 100 mcg by mouth daily before breakfast.    . Multiple Vitamin (MULTIVITAMIN WITH MINERALS) TABS tablet Take 1 tablet by mouth daily in the afternoon.    . pantoprazole (PROTONIX) 40 MG tablet Take 1 tablet (40 mg total) by mouth daily. 30 tablet 0  . cephALEXin (KEFLEX) 500 MG capsule Take 2 capsules (1,000 mg total) by mouth 2 (two) times daily. For seven days. (Patient not taking: Reported on 07/07/2020) 28 capsule 0    Results for orders placed or performed during the hospital encounter of 07/14/20 (from the past 48 hour(s))  SARS CORONAVIRUS 2 (TAT 6-24 HRS) Nasopharyngeal Nasopharyngeal Swab     Status: None   Collection Time: 07/14/20  8:02 AM  Specimen: Nasopharyngeal Swab  Result Value Ref Range   SARS Coronavirus 2 NEGATIVE NEGATIVE    Comment: (NOTE) SARS-CoV-2 target nucleic acids are NOT DETECTED.  The SARS-CoV-2 RNA is generally detectable in upper and lower respiratory specimens during the acute phase of infection. Negative results do not preclude SARS-CoV-2 infection, do not rule out co-infections with other pathogens, and should not be used as the sole basis for treatment or other patient management decisions. Negative results must be combined with clinical  observations, patient history, and epidemiological information. The expected result is Negative.  Fact Sheet for Patients: SugarRoll.be  Fact Sheet for Healthcare Providers: https://www.woods-mathews.com/  This test is not yet approved or cleared by the [place] FDA and  has been authorized for detection and/or diagnosis of SARS-CoV-2 by FDA under an Emergency Use Authorization (EUA). This EUA will remain  in effect (meaning this test can be used) for the duration of the COVID-19 declaration under Se ction 564(b)(1) of the Act, 21 U.S.C. section 360bbb-3(b)(1), unless the authorization is terminated or revoked sooner.  Performed at [facility], [place] [address], [place], [place] [address]    No results found.  Review of Systems  Blood pressure 124/73, temperature 98.1 F (36.7 C), temperature source Oral, resp. rate 17, height 5' 6.5" (1.689 m), weight 69.4 kg, SpO2 95 %. Physical Exam HENT:     Mouth/Throat:     Mouth: Mucous membranes are moist.     Pharynx: Oropharynx is clear.  Eyes:     General: No scleral icterus.    Conjunctiva/sclera: Conjunctivae normal.  Cardiovascular:     Rate and Rhythm: Normal rate and regular rhythm.     Heart sounds: Normal heart sounds. No murmur heard.   Pulmonary:     Effort: Pulmonary effort is normal.     Breath sounds: Normal breath sounds.  Abdominal:     General: There is no distension.     Palpations: Abdomen is soft. There is no mass.     Tenderness: There is no abdominal tenderness.  Musculoskeletal:        General: No swelling.     Cervical back: Neck supple.  Lymphadenopathy:     Cervical: No cervical adenopathy.  Skin:    General: Skin is warm and dry.  Neurological:     Mental Status: She is alert.      Assessment/Plan  Esophageal dysphagia in patient with chronic GERD and history of distal esophageal ring/stricture. Esophagogastroduodenoscopy with  esophageal dilation.  Hildred Laser, MD 07/15/2020, 9:22 AM

## 2020-07-20 ENCOUNTER — Encounter (HOSPITAL_COMMUNITY): Payer: Self-pay | Admitting: Internal Medicine

## 2020-07-29 DIAGNOSIS — K219 Gastro-esophageal reflux disease without esophagitis: Secondary | ICD-10-CM | POA: Diagnosis not present

## 2020-07-29 DIAGNOSIS — K760 Fatty (change of) liver, not elsewhere classified: Secondary | ICD-10-CM | POA: Diagnosis not present

## 2020-07-29 DIAGNOSIS — E039 Hypothyroidism, unspecified: Secondary | ICD-10-CM | POA: Diagnosis not present

## 2020-07-29 DIAGNOSIS — E7849 Other hyperlipidemia: Secondary | ICD-10-CM | POA: Diagnosis not present

## 2020-07-29 DIAGNOSIS — E782 Mixed hyperlipidemia: Secondary | ICD-10-CM | POA: Diagnosis not present

## 2020-08-02 DIAGNOSIS — K5730 Diverticulosis of large intestine without perforation or abscess without bleeding: Secondary | ICD-10-CM | POA: Diagnosis not present

## 2020-08-02 DIAGNOSIS — E7849 Other hyperlipidemia: Secondary | ICD-10-CM | POA: Diagnosis not present

## 2020-08-02 DIAGNOSIS — Z6824 Body mass index (BMI) 24.0-24.9, adult: Secondary | ICD-10-CM | POA: Diagnosis not present

## 2020-08-02 DIAGNOSIS — K7581 Nonalcoholic steatohepatitis (NASH): Secondary | ICD-10-CM | POA: Diagnosis not present

## 2020-08-02 DIAGNOSIS — E039 Hypothyroidism, unspecified: Secondary | ICD-10-CM | POA: Diagnosis not present

## 2020-08-12 DIAGNOSIS — M810 Age-related osteoporosis without current pathological fracture: Secondary | ICD-10-CM | POA: Diagnosis not present

## 2020-08-12 DIAGNOSIS — M85852 Other specified disorders of bone density and structure, left thigh: Secondary | ICD-10-CM | POA: Diagnosis not present

## 2020-08-12 DIAGNOSIS — Z780 Asymptomatic menopausal state: Secondary | ICD-10-CM | POA: Diagnosis not present

## 2020-08-13 ENCOUNTER — Telehealth (INDEPENDENT_AMBULATORY_CARE_PROVIDER_SITE_OTHER): Payer: Self-pay

## 2020-09-23 ENCOUNTER — Other Ambulatory Visit (INDEPENDENT_AMBULATORY_CARE_PROVIDER_SITE_OTHER): Payer: Self-pay | Admitting: Internal Medicine

## 2020-09-23 DIAGNOSIS — R7401 Elevation of levels of liver transaminase levels: Secondary | ICD-10-CM | POA: Diagnosis not present

## 2020-09-23 LAB — HEPATIC FUNCTION PANEL
AG Ratio: 1.6 (calc) (ref 1.0–2.5)
ALT: 29 U/L (ref 6–29)
AST: 22 U/L (ref 10–35)
Albumin: 4.4 g/dL (ref 3.6–5.1)
Alkaline phosphatase (APISO): 69 U/L (ref 37–153)
Bilirubin, Direct: 0.1 mg/dL (ref 0.0–0.2)
Globulin: 2.7 g/dL (calc) (ref 1.9–3.7)
Indirect Bilirubin: 0.5 mg/dL (calc) (ref 0.2–1.2)
Total Bilirubin: 0.6 mg/dL (ref 0.2–1.2)
Total Protein: 7.1 g/dL (ref 6.1–8.1)

## 2020-09-29 DIAGNOSIS — Z0100 Encounter for examination of eyes and vision without abnormal findings: Secondary | ICD-10-CM | POA: Diagnosis not present

## 2020-09-29 DIAGNOSIS — H5200 Hypermetropia, unspecified eye: Secondary | ICD-10-CM | POA: Diagnosis not present

## 2020-10-06 DIAGNOSIS — D2271 Melanocytic nevi of right lower limb, including hip: Secondary | ICD-10-CM | POA: Diagnosis not present

## 2020-10-06 DIAGNOSIS — D485 Neoplasm of uncertain behavior of skin: Secondary | ICD-10-CM | POA: Diagnosis not present

## 2020-10-13 DIAGNOSIS — Z1283 Encounter for screening for malignant neoplasm of skin: Secondary | ICD-10-CM | POA: Diagnosis not present

## 2020-10-13 DIAGNOSIS — L570 Actinic keratosis: Secondary | ICD-10-CM | POA: Diagnosis not present

## 2020-10-13 DIAGNOSIS — L661 Lichen planopilaris: Secondary | ICD-10-CM | POA: Diagnosis not present

## 2020-10-19 ENCOUNTER — Other Ambulatory Visit: Payer: Self-pay

## 2020-10-19 ENCOUNTER — Emergency Department (HOSPITAL_COMMUNITY)
Admission: EM | Admit: 2020-10-19 | Discharge: 2020-10-19 | Disposition: A | Discharge status: Home / Self Care | Payer: Medicare HMO | Attending: Emergency Medicine | Admitting: Emergency Medicine

## 2020-10-19 ENCOUNTER — Emergency Department (HOSPITAL_COMMUNITY): Payer: Medicare HMO

## 2020-10-19 ENCOUNTER — Encounter (HOSPITAL_COMMUNITY): Payer: Self-pay | Admitting: Emergency Medicine

## 2020-10-19 DIAGNOSIS — K219 Gastro-esophageal reflux disease without esophagitis: Secondary | ICD-10-CM | POA: Diagnosis not present

## 2020-10-19 DIAGNOSIS — R001 Bradycardia, unspecified: Secondary | ICD-10-CM | POA: Diagnosis not present

## 2020-10-19 DIAGNOSIS — K5732 Diverticulitis of large intestine without perforation or abscess without bleeding: Secondary | ICD-10-CM | POA: Insufficient documentation

## 2020-10-19 DIAGNOSIS — E039 Hypothyroidism, unspecified: Secondary | ICD-10-CM | POA: Insufficient documentation

## 2020-10-19 DIAGNOSIS — R1032 Left lower quadrant pain: Secondary | ICD-10-CM | POA: Diagnosis not present

## 2020-10-19 DIAGNOSIS — D72829 Elevated white blood cell count, unspecified: Secondary | ICD-10-CM | POA: Diagnosis not present

## 2020-10-19 DIAGNOSIS — Z79899 Other long term (current) drug therapy: Secondary | ICD-10-CM | POA: Insufficient documentation

## 2020-10-19 DIAGNOSIS — R1031 Right lower quadrant pain: Secondary | ICD-10-CM | POA: Diagnosis present

## 2020-10-19 DIAGNOSIS — K5792 Diverticulitis of intestine, part unspecified, without perforation or abscess without bleeding: Secondary | ICD-10-CM | POA: Diagnosis not present

## 2020-10-19 DIAGNOSIS — R1030 Lower abdominal pain, unspecified: Secondary | ICD-10-CM

## 2020-10-19 DIAGNOSIS — D252 Subserosal leiomyoma of uterus: Secondary | ICD-10-CM | POA: Diagnosis not present

## 2020-10-19 DIAGNOSIS — R110 Nausea: Secondary | ICD-10-CM | POA: Diagnosis not present

## 2020-10-19 LAB — COMPREHENSIVE METABOLIC PANEL
ALT: 31 U/L (ref 0–44)
AST: 24 U/L (ref 15–41)
Albumin: 4.4 g/dL (ref 3.5–5.0)
Alkaline Phosphatase: 73 U/L (ref 38–126)
Anion gap: 10 (ref 5–15)
BUN: 13 mg/dL (ref 8–23)
CO2: 24 mmol/L (ref 22–32)
Calcium: 9.6 mg/dL (ref 8.9–10.3)
Chloride: 103 mmol/L (ref 98–111)
Creatinine, Ser: 0.83 mg/dL (ref 0.44–1.00)
GFR, Estimated: >60 mL/min (ref >60)
Glucose, Bld: 121 mg/dL — ABNORMAL HIGH (ref 70–99)
Potassium: 3.7 mmol/L (ref 3.5–5.1)
Sodium: 137 mmol/L (ref 135–145)
Total Bilirubin: 0.8 mg/dL (ref 0.3–1.2)
Total Protein: 7.9 g/dL (ref 6.5–8.1)

## 2020-10-19 LAB — URINALYSIS, ROUTINE W REFLEX MICROSCOPIC
Bilirubin Urine: NEGATIVE
Glucose, UA: NEGATIVE mg/dL
Hgb urine dipstick: NEGATIVE
Ketones, ur: NEGATIVE mg/dL
Leukocytes,Ua: NEGATIVE
Nitrite: NEGATIVE
Protein, ur: NEGATIVE mg/dL
Specific Gravity, Urine: 1.012 (ref 1.005–1.030)
pH: 6 (ref 5.0–8.0)

## 2020-10-19 LAB — CBC WITH DIFFERENTIAL/PLATELET
Abs Immature Granulocytes: 0.06 10*3/uL (ref 0.00–0.07)
Basophils Absolute: 0 10*3/uL (ref 0.0–0.1)
Basophils Relative: 0 %
Eosinophils Absolute: 0.1 10*3/uL (ref 0.0–0.5)
Eosinophils Relative: 1 %
HCT: 45.9 % (ref 36.0–46.0)
Hemoglobin: 15.2 g/dL — ABNORMAL HIGH (ref 12.0–15.0)
Immature Granulocytes: 1 %
Lymphocytes Relative: 7 %
Lymphs Abs: 0.8 10*3/uL (ref 0.7–4.0)
MCH: 29.3 pg (ref 26.0–34.0)
MCHC: 33.1 g/dL (ref 30.0–36.0)
MCV: 88.4 fL (ref 80.0–100.0)
Monocytes Absolute: 0.7 10*3/uL (ref 0.1–1.0)
Monocytes Relative: 7 %
Neutro Abs: 9.4 10*3/uL — ABNORMAL HIGH (ref 1.7–7.7)
Neutrophils Relative %: 84 %
Platelets: 203 10*3/uL (ref 150–400)
RBC: 5.19 MIL/uL — ABNORMAL HIGH (ref 3.87–5.11)
RDW: 13 % (ref 11.5–15.5)
WBC: 11 10*3/uL — ABNORMAL HIGH (ref 4.0–10.5)
nRBC: 0 % (ref 0.0–0.2)

## 2020-10-19 LAB — LIPASE, BLOOD: Lipase: 39 U/L (ref 11–51)

## 2020-10-19 MED ORDER — ONDANSETRON HCL 4 MG/2ML IJ SOLN
4.0000 mg | Freq: Once | INTRAMUSCULAR | Status: AC
  Administered 2020-10-19: 4 mg via INTRAVENOUS
  Filled 2020-10-19: qty 2

## 2020-10-19 MED ORDER — KETOROLAC TROMETHAMINE 15 MG/ML IJ SOLN
15.0000 mg | Freq: Once | INTRAMUSCULAR | Status: AC
  Administered 2020-10-19: 15 mg via INTRAVENOUS
  Filled 2020-10-19: qty 1

## 2020-10-19 MED ORDER — IOHEXOL 300 MG/ML  SOLN
100.0000 mL | Freq: Once | INTRAMUSCULAR | Status: AC | PRN
  Administered 2020-10-19: 100 mL via INTRAVENOUS

## 2020-10-19 MED ORDER — SODIUM CHLORIDE 0.9 % IV BOLUS
500.0000 mL | Freq: Once | INTRAVENOUS | Status: AC
  Administered 2020-10-19: 500 mL via INTRAVENOUS

## 2020-10-19 NOTE — ED Provider Notes (Signed)
[facility] EMERGENCY DEPARTMENT Provider Note   CSN: [id number] Arrival date & time: 10/19/20  1106     History Chief Complaint  Patient presents with   Abdominal Pain    Desiree Flores is a 75 y.o. female.  Patient with history of dysphagia, pancreatitis, duodenitis, diverticulitis presents with worsening lower abdominal pain bilateral with past 24 hours.  Intermittent sharp severe with brief episodes of decreased pain.  Currently symptoms mild.  Patient had nausea without vomiting.  Patient had soft small amount of bowel movement followed by a few firm episodes.  No history of constipation.  Patient been taking fiber.  Denies other new medications.      Past Medical History:  Diagnosis Date   Diverticulitis    Hypothyroidism     Patient Active Problem List   Diagnosis Date Noted   ALT (SGPT) level raised 06/03/2020   Esophageal dysphagia 08/21/2017   Dysphagia 08/03/2015   Schatzki's ring: s/p dilatation per EGD 08/03/2015 Per Dr Laural Golden 08/03/2015   Pancreatitis, acute    Duodenitis    Pancreatitis 07/31/2015   GERD (gastroesophageal reflux disease) 07/31/2015   Hypothyroidism 07/31/2015   Diverticulitis, duodenum    Epigastric abdominal pain    Unspecified hypothyroidism 12/15/2013   Diverticulitis 12/15/2013    Past Surgical History:  Procedure Laterality Date   APPENDECTOMY     6-7 yrs ago   COLONOSCOPY     COLONOSCOPY N/A 01/28/2014   Procedure: COLONOSCOPY;  Surgeon: Rogene Houston, MD;  Location: [facility];  Service: Endoscopy;  Laterality: N/A;  200   DILATION AND CURETTAGE OF UTERUS     miscarriage   ESOPHAGEAL DILATION  08/03/2015   Procedure: ESOPHAGEAL DILATION;  Surgeon: Rogene Houston, MD;  Location: [facility];  Service: Endoscopy;;   ESOPHAGEAL DILATION N/A 09/19/2017   Procedure: ESOPHAGEAL DILATION;  Surgeon: Rogene Houston, MD;  Location: [facility];  Service: Endoscopy;  Laterality: N/A;   ESOPHAGEAL DILATION N/A 07/15/2020    Procedure: ESOPHAGEAL DILATION;  Surgeon: Rogene Houston, MD;  Location: [facility];  Service: Endoscopy;  Laterality: N/A;   ESOPHAGOGASTRODUODENOSCOPY N/A 08/03/2015   Procedure: ESOPHAGOGASTRODUODENOSCOPY (EGD);  Surgeon: Rogene Houston, MD;  Location: [facility];  Service: Endoscopy;  Laterality: N/A;   ESOPHAGOGASTRODUODENOSCOPY N/A 09/19/2017   Procedure: ESOPHAGOGASTRODUODENOSCOPY (EGD);  Surgeon: Rogene Houston, MD;  Location: [facility];  Service: Endoscopy;  Laterality: N/A;  12:40-rescheduled to 5/15 @ 7:30am per Lelon Frohlich   ESOPHAGOGASTRODUODENOSCOPY N/A 07/15/2020   Procedure: ESOPHAGOGASTRODUODENOSCOPY (EGD);  Surgeon: Rogene Houston, MD;  Location: [facility];  Service: Endoscopy;  Laterality: N/A;  AM     OB History   No obstetric history on file.     Family History  Problem Relation Age of Onset   Colon cancer Other     Social History   Tobacco Use   Smoking status: Never   Smokeless tobacco: Never  Vaping Use   Vaping Use: Never used  Substance Use Topics   Alcohol use: No   Drug use: No    Home Medications Prior to Admission medications   Medication Sig Start Date End Date Taking? Authorizing Provider  acetaminophen (TYLENOL) 325 MG tablet Take 325-650 mg by mouth every 6 (six) hours as needed for moderate pain or headache.    [provider]  Calcium Carbonate-Vitamin D (CALCIUM-VITAMIN D3 PO) Take 1 tablet by mouth daily in the afternoon.    [provider]  Glucosamine  HCl (GLUCOSAMINE PO) Take 2 tablets by mouth daily in the afternoon.    [provider]  levothyroxine (SYNTHROID, LEVOTHROID) 100 MCG tablet Take 100 mcg by mouth daily before breakfast.    [provider]  Multiple Vitamin (MULTIVITAMIN WITH MINERALS) TABS tablet Take 1 tablet by mouth daily in the afternoon.    [provider]  pantoprazole (PROTONIX) 40 MG tablet Take 1 tablet (40 mg total) by mouth daily. 06/03/20   Rogene Houston,  MD    Allergies    Sulfa antibiotics  Review of Systems   Review of Systems  Constitutional:  Negative for chills and fever.  HENT:  Negative for congestion.   Eyes:  Negative for visual disturbance.  Respiratory:  Negative for shortness of breath.   Cardiovascular:  Negative for chest pain.  Gastrointestinal:  Positive for abdominal pain, constipation and nausea. Negative for vomiting.  Genitourinary:  Negative for dysuria and flank pain.  Musculoskeletal:  Negative for back pain, neck pain and neck stiffness.  Skin:  Negative for rash.  Neurological:  Negative for light-headedness and headaches.   Physical Exam Updated Vital Signs BP 126/72   Pulse 81   Temp 98.8 F (37.1 C) (Oral)   Resp 17   Ht 5\' 6"  (1.676 m)   Wt 72.6 kg   SpO2 92%   BMI 25.82 kg/m   Physical Exam Vitals and nursing note reviewed.  Constitutional:      General: She is not in acute distress.    Appearance: She is well-developed. She is not ill-appearing.  HENT:     Head: Normocephalic and atraumatic.     Mouth/Throat:     Mouth: Mucous membranes are dry.  Eyes:     General:        Right eye: No discharge.        Left eye: No discharge.     Conjunctiva/sclera: Conjunctivae normal.  Neck:     Trachea: No tracheal deviation.  Cardiovascular:     Rate and Rhythm: Regular rhythm. Bradycardia present.     Heart sounds: No murmur heard. Pulmonary:     Effort: Pulmonary effort is normal.     Breath sounds: Normal breath sounds.  Abdominal:     General: There is no distension.     Palpations: Abdomen is soft.     Tenderness: There is abdominal tenderness in the suprapubic area and left lower quadrant. There is no guarding.  Musculoskeletal:     Cervical back: Normal range of motion and neck supple.  Skin:    General: Skin is warm.     Capillary Refill: Capillary refill takes less than 2 seconds.     Findings: No rash.  Neurological:     General: No focal deficit present.     Mental  Status: She is alert and oriented to person, place, and time.     Cranial Nerves: No cranial nerve deficit.     Motor: No weakness.  Psychiatric:        Mood and Affect: Mood normal.    ED Results / Procedures / Treatments   Labs (all labs ordered are listed, but only abnormal results are displayed) Labs Reviewed  COMPREHENSIVE METABOLIC PANEL - Abnormal; Notable for the following components:      Result Value   Glucose, Bld 121 (*)    All other components within normal limits  CBC WITH DIFFERENTIAL/PLATELET - Abnormal; Notable for the following components:   WBC 11.0 (*)  RBC 5.19 (*)    Hemoglobin 15.2 (*)    Neutro Abs 9.4 (*)    All other components within normal limits  LIPASE, BLOOD  URINALYSIS, ROUTINE W REFLEX MICROSCOPIC    EKG None  Radiology CT ABDOMEN PELVIS W CONTRAST  Result Date: 10/19/2020 CLINICAL DATA:  Lower abdominal and pelvic pain for 1 day.  Nausea. EXAM: CT ABDOMEN AND PELVIS WITH CONTRAST TECHNIQUE: Multidetector CT imaging of the abdomen and pelvis was performed using the standard protocol following bolus administration of intravenous contrast. CONTRAST:  181mL OMNIPAQUE IOHEXOL 300 MG/ML  SOLN COMPARISON:  07/31/2015 FINDINGS: Lower Chest: No acute findings. Hepatobiliary: No hepatic masses identified. Gallbladder is unremarkable. No evidence of biliary ductal dilatation. Pancreas:  No mass or inflammatory changes. Spleen: Within normal limits in size and appearance. Adrenals/Urinary Tract: No masses identified. No evidence of ureteral calculi or hydronephrosis. Stomach/Bowel: Mild diverticulitis is seen involving the sigmoid colon. No evidence of abscess, bowel obstruction, or free intraperitoneal air. Vascular/Lymphatic: No pathologically enlarged lymph nodes. No acute vascular findings. Reproductive: 2 subserosal fibroids are seen in the uterine fundus, largest measuring 3.3 cm, without significant change since prior study. Adnexal regions are  unremarkable. Other:  None. Musculoskeletal:  No suspicious bone lesions identified. IMPRESSION: Mild sigmoid diverticulitis. No evidence of abscess or other complication. Stable small uterine fibroids. Electronically Signed   By: Marlaine Hind M.D.   On: 10/19/2020 13:24    Procedures Procedures   Medications Ordered in ED Medications  ondansetron (ZOFRAN) injection 4 mg (4 mg Intravenous Given 10/19/20 1200)  sodium chloride 0.9 % bolus 500 mL (0 mLs Intravenous Stopped 10/19/20 1338)  iohexol (OMNIPAQUE) 300 MG/ML solution 100 mL (100 mLs Intravenous Contrast Given 10/19/20 1305)    ED Course  I have reviewed the triage vital signs and the nursing notes.  Pertinent labs & imaging results that were available during my care of the patient were reviewed by me and considered in my medical decision making (see chart for details).    MDM Rules/Calculators/A&P                          Patient presents with worsening lower abdominal pain on exam worse left lower quadrant.  Discussed differential diagnosis including pancreatitis, diverticulitis, kidney stone, bowel related such as constipation, other.  Urinalysis reviewed no sign of infection, general blood work reviewed reassuring showing normal liver function, normal pancreas levels, electrolytes unremarkable and no significant anemia.  Patient has mild leukocytosis 11 with a shift.  CT scan results pending.  CT scan results reviewed showing mild diverticulitis no perforation or abscess.  Pain control in the ER.  Patient stable for outpatient follow-up. Final Clinical Impression(s) / ED Diagnoses Final diagnoses:  Lower abdominal pain  Acute diverticulitis    Rx / DC Orders ED Discharge Orders     None        Elnora Morrison, MD 10/19/20 1414

## 2020-10-19 NOTE — Discharge Instructions (Addendum)
Finish your antibiotics that your primary doctor wrote before. Soft diet until pain resolved. Return for fevers, uncontrolled pain or new concerns. Use Tylenol every 4 hours for pain and you can take MiraLAX from the local pharmacy for constipation symptoms as needed.

## 2020-10-19 NOTE — ED Triage Notes (Signed)
Pt to the ED with abdominal pain for the past 24 hours.  Pt states she has nausea with constipation.

## 2020-10-21 DIAGNOSIS — D229 Melanocytic nevi, unspecified: Secondary | ICD-10-CM | POA: Diagnosis not present

## 2020-10-21 DIAGNOSIS — D2272 Melanocytic nevi of left lower limb, including hip: Secondary | ICD-10-CM | POA: Diagnosis not present

## 2020-10-21 DIAGNOSIS — L989 Disorder of the skin and subcutaneous tissue, unspecified: Secondary | ICD-10-CM | POA: Diagnosis not present

## 2020-10-21 DIAGNOSIS — L988 Other specified disorders of the skin and subcutaneous tissue: Secondary | ICD-10-CM | POA: Diagnosis not present

## 2020-10-21 DIAGNOSIS — D239 Other benign neoplasm of skin, unspecified: Secondary | ICD-10-CM | POA: Diagnosis not present

## 2020-11-04 DIAGNOSIS — J029 Acute pharyngitis, unspecified: Secondary | ICD-10-CM | POA: Diagnosis not present

## 2020-11-04 DIAGNOSIS — Z20828 Contact with and (suspected) exposure to other viral communicable diseases: Secondary | ICD-10-CM | POA: Diagnosis not present

## 2020-12-07 ENCOUNTER — Ambulatory Visit (INDEPENDENT_AMBULATORY_CARE_PROVIDER_SITE_OTHER): Payer: Medicare HMO | Admitting: Internal Medicine

## 2021-01-18 ENCOUNTER — Other Ambulatory Visit: Payer: Self-pay

## 2021-01-18 ENCOUNTER — Ambulatory Visit (INDEPENDENT_AMBULATORY_CARE_PROVIDER_SITE_OTHER): Payer: Medicare HMO | Admitting: Internal Medicine

## 2021-01-18 ENCOUNTER — Encounter (INDEPENDENT_AMBULATORY_CARE_PROVIDER_SITE_OTHER): Payer: Self-pay | Admitting: Internal Medicine

## 2021-01-18 VITALS — BP 115/75 | HR 83 | Temp 99.3°F | Ht 67.0 in | Wt 159.0 lb

## 2021-01-18 DIAGNOSIS — K219 Gastro-esophageal reflux disease without esophagitis: Secondary | ICD-10-CM

## 2021-01-18 DIAGNOSIS — K222 Esophageal obstruction: Secondary | ICD-10-CM | POA: Diagnosis not present

## 2021-01-18 DIAGNOSIS — Z8719 Personal history of other diseases of the digestive system: Secondary | ICD-10-CM

## 2021-01-18 MED ORDER — CIPROFLOXACIN HCL 500 MG PO TABS: 500.0000 mg | ORAL_TABLET | Freq: Two times a day (BID) | ORAL | 0 refills | Status: DC

## 2021-01-18 MED ORDER — METRONIDAZOLE 500 MG PO TABS: 500.0000 mg | ORAL_TABLET | Freq: Two times a day (BID) | ORAL | 0 refills | Status: DC

## 2021-01-18 NOTE — Progress Notes (Signed)
Presenting complaint;  Follow-up for GERD and dysphagia Patient reports recent bout of diverticulitis.  Database and subjective:  Patient is 75-year-old Caucasian female who is here for scheduled visit.  Following her last visit in January 2022 she underwent EGD revealing esophageal stricture which was dilated.  She has been maintained on a PPI. She states she is able to swallow without any difficulty.  She rarely has heartburn relieved with Tums.  She is not having any side effects with bone density. Since her last visit she has been treated for diverticulitis by Dr. Terry Daniel.  She says she has had 4-5 episodes.  She required hospitalization for first episode.  Her last colonoscopy was in August 2015 revealing sigmoid diverticulosis.  She is on Citrucel but only takes it 3-4 times a week.  Her bowels generally move daily. She does give history of osteopenia.  Current Medications: Outpatient Encounter Medications as of 01/18/2021  Medication Sig   acetaminophen (TYLENOL) 325 MG tablet Take 325-650 mg by mouth every 6 (six) hours as needed for moderate pain or headache.   Calcium Carbonate-Vitamin D (CALCIUM-VITAMIN D3 PO) Take 1 tablet by mouth daily in the afternoon.   Glucosamine HCl (GLUCOSAMINE PO) Take 2 tablets by mouth daily in the afternoon.   levothyroxine (SYNTHROID, LEVOTHROID) 100 MCG tablet Take 100 mcg by mouth daily before breakfast.   Multiple Vitamin (MULTIVITAMIN WITH MINERALS) TABS tablet Take 1 tablet by mouth daily in the afternoon.   pantoprazole (PROTONIX) 40 MG tablet Take 1 tablet (40 mg total) by mouth daily.   psyllium (HYDROCIL/METAMUCIL) 95 % PACK Take 1 packet by mouth daily.   No facility-administered encounter medications on file as of 01/18/2021.     Objective: Blood pressure 115/75, pulse 83, temperature 99.3 F (37.4 C), temperature source Oral, height 5' 7" (1.702 m), weight 159 lb (72.1 kg). Patient is alert and in no acute distress. Conjunctiva  is pink. Sclera is nonicteric Oropharyngeal mucosa is normal. No neck masses or thyromegaly noted. Cardiac exam with regular rhythm normal S1 and S2. No murmur or gallop noted. Lungs are clear to auscultation. Abdomen is symmetrical soft and nontender with organomegaly or masses. No LE edema or clubbing noted.  Labs/studies Results:   CBC Latest Ref Rng & Units 10/19/2020 08/04/2015 08/03/2015  WBC 4.0 - 10.5 K/uL 11.0(H) 5.9 5.6  Hemoglobin 12.0 - 15.0 g/dL 15.2(H) 13.0 12.1  Hematocrit 36.0 - 46.0 % 45.9 37.2 36.4  Platelets 150 - 400 K/uL 203 160 144(L)    CMP Latest Ref Rng & Units 10/19/2020 09/23/2020 06/18/2020  Glucose 70 - 99 mg/dL 121(H) - -  BUN 8 - 23 mg/dL 13 - -  Creatinine 0.44 - 1.00 mg/dL 0.83 - -  Sodium 135 - 145 mmol/L 137 - -  Potassium 3.5 - 5.1 mmol/L 3.7 - -  Chloride 98 - 111 mmol/L 103 - -  CO2 22 - 32 mmol/L 24 - -  Calcium 8.9 - 10.3 mg/dL 9.6 - -  Total Protein 6.5 - 8.1 g/dL 7.9 7.1 7.3  Total Bilirubin 0.3 - 1.2 mg/dL 0.8 0.6 0.6  Alkaline Phos 38 - 126 U/L 73 - -  AST 15 - 41 U/L 24 22 24  ALT 0 - 44 U/L 31 29 30(H)    Hepatic Function Latest Ref Rng & Units 10/19/2020 09/23/2020 06/18/2020  Total Protein 6.5 - 8.1 g/dL 7.9 7.1 7.3  Albumin 3.5 - 5.0 g/dL 4.4 - -  AST 15 - 41 U/L 24 22   24  ALT 0 - 44 U/L 31 29 30(H)  Alk Phosphatase 38 - 126 U/L 73 - -  Total Bilirubin 0.3 - 1.2 mg/dL 0.8 0.6 0.6  Bilirubin, Direct 0.0 - 0.2 mg/dL - 0.1 0.1    Recent lab data reviewed.  Assessment:  #1.  Dysphagia secondary to esophageal stricture.  She has responded to esophageal dilation.  Future dilations on as-needed basis.  #2.  Chronic GERD.  She is doing well with daily PPI dose.  We will try on an every other day starting January 2023 given history of osteopenia.  She can use OTC and antacids on as-needed basis of breakthrough symptoms.  #3.  History of diverticulitis.  She has had 4-5 episodes.  He needs to keep antibiotic on hand just in case.  #4.   History of fatty liver.  Transaminases are normal.   Plan:  Patient advised to take Citrucel 3 to 4 g daily at bedtime. Prescription given for Cipro 500 mg and metronidazole 500 mg to be taken twice daily after meals if she has typical symptoms of diverticulitis.  She should take it for anywhere from 7 to 10 days.  Patient also advised to call office if she has to go on antibiotic. Starting in January 2023 she will try pantoprazole every other day. Office visit in 1 year.      

## 2021-01-18 NOTE — Patient Instructions (Addendum)
Take pantoprazole 40 mg every other day starting in January 2023. Take Citrucel or equivalent 3 to 4 g by mouth daily at bedtime. If you have symptoms suggestive of diverticulitis start taking Cipro and metronidazole and also call office. If you have diverticulitis still on low-dose residue diet for few days and do not take fiber supplement for that duration as well.

## 2021-02-04 DIAGNOSIS — K219 Gastro-esophageal reflux disease without esophagitis: Secondary | ICD-10-CM | POA: Diagnosis not present

## 2021-02-04 DIAGNOSIS — K760 Fatty (change of) liver, not elsewhere classified: Secondary | ICD-10-CM | POA: Diagnosis not present

## 2021-02-04 DIAGNOSIS — E7849 Other hyperlipidemia: Secondary | ICD-10-CM | POA: Diagnosis not present

## 2021-02-04 DIAGNOSIS — E782 Mixed hyperlipidemia: Secondary | ICD-10-CM | POA: Diagnosis not present

## 2021-02-04 DIAGNOSIS — E039 Hypothyroidism, unspecified: Secondary | ICD-10-CM | POA: Diagnosis not present

## 2021-02-04 DIAGNOSIS — K7581 Nonalcoholic steatohepatitis (NASH): Secondary | ICD-10-CM | POA: Diagnosis not present

## 2021-02-08 DIAGNOSIS — E039 Hypothyroidism, unspecified: Secondary | ICD-10-CM | POA: Diagnosis not present

## 2021-02-08 DIAGNOSIS — Z6825 Body mass index (BMI) 25.0-25.9, adult: Secondary | ICD-10-CM | POA: Diagnosis not present

## 2021-02-08 DIAGNOSIS — E7849 Other hyperlipidemia: Secondary | ICD-10-CM | POA: Diagnosis not present

## 2021-02-08 DIAGNOSIS — K7581 Nonalcoholic steatohepatitis (NASH): Secondary | ICD-10-CM | POA: Diagnosis not present

## 2021-02-08 DIAGNOSIS — Z23 Encounter for immunization: Secondary | ICD-10-CM | POA: Diagnosis not present

## 2021-02-08 DIAGNOSIS — K5730 Diverticulosis of large intestine without perforation or abscess without bleeding: Secondary | ICD-10-CM | POA: Diagnosis not present

## 2021-02-08 DIAGNOSIS — M8580 Other specified disorders of bone density and structure, unspecified site: Secondary | ICD-10-CM | POA: Diagnosis not present

## 2021-02-08 DIAGNOSIS — Z0001 Encounter for general adult medical examination with abnormal findings: Secondary | ICD-10-CM | POA: Diagnosis not present

## 2021-02-09 DIAGNOSIS — Z23 Encounter for immunization: Secondary | ICD-10-CM | POA: Diagnosis not present

## 2021-04-04 DIAGNOSIS — Z20828 Contact with and (suspected) exposure to other viral communicable diseases: Secondary | ICD-10-CM | POA: Diagnosis not present

## 2021-04-04 DIAGNOSIS — J101 Influenza due to other identified influenza virus with other respiratory manifestations: Secondary | ICD-10-CM | POA: Diagnosis not present

## 2021-04-06 DIAGNOSIS — J101 Influenza due to other identified influenza virus with other respiratory manifestations: Secondary | ICD-10-CM | POA: Diagnosis not present

## 2021-07-04 DIAGNOSIS — H6121 Impacted cerumen, right ear: Secondary | ICD-10-CM | POA: Diagnosis not present

## 2021-07-04 DIAGNOSIS — Z6825 Body mass index (BMI) 25.0-25.9, adult: Secondary | ICD-10-CM | POA: Diagnosis not present

## 2021-07-04 DIAGNOSIS — H6122 Impacted cerumen, left ear: Secondary | ICD-10-CM | POA: Diagnosis not present

## 2021-07-04 DIAGNOSIS — H9209 Otalgia, unspecified ear: Secondary | ICD-10-CM | POA: Diagnosis not present

## 2021-07-19 ENCOUNTER — Ambulatory Visit (INDEPENDENT_AMBULATORY_CARE_PROVIDER_SITE_OTHER): Payer: Medicare HMO | Admitting: Internal Medicine

## 2021-07-19 ENCOUNTER — Encounter (INDEPENDENT_AMBULATORY_CARE_PROVIDER_SITE_OTHER): Payer: Self-pay | Admitting: Internal Medicine

## 2021-07-19 ENCOUNTER — Other Ambulatory Visit: Payer: Self-pay

## 2021-07-19 VITALS — BP 118/72 | HR 66 | Temp 98.7°F | Ht 67.0 in | Wt 162.2 lb

## 2021-07-19 DIAGNOSIS — K222 Esophageal obstruction: Secondary | ICD-10-CM | POA: Diagnosis not present

## 2021-07-19 DIAGNOSIS — K219 Gastro-esophageal reflux disease without esophagitis: Secondary | ICD-10-CM | POA: Diagnosis not present

## 2021-07-19 MED ORDER — PANTOPRAZOLE SODIUM 40 MG PO TBEC: 40.0000 mg | DELAYED_RELEASE_TABLET | Freq: Every day | ORAL | 1 refills | Status: DC

## 2021-07-19 MED ORDER — PANTOPRAZOLE SODIUM 40 MG PO TBEC: 40.0000 mg | DELAYED_RELEASE_TABLET | Freq: Every day | ORAL | 3 refills | Status: DC

## 2021-07-19 MED ORDER — PANTOPRAZOLE SODIUM 40 MG PO TBEC: 40.0000 mg | DELAYED_RELEASE_TABLET | Freq: Two times a day (BID) | ORAL | 1 refills | Status: DC

## 2021-07-19 NOTE — Patient Instructions (Signed)
Remember to eat slowly and chew food thoroughly before swallowing ?Please call office with progress report in 2 months. ?Keep symptom diary as discussed( chest pain and heartburn and lump in throat) ?

## 2021-07-19 NOTE — Progress Notes (Signed)
Presenting complaint; ? ?Follow-up for chronic GERD complicated by esophageal stricture. ? ?Database and subjective: ? ?Patient is 76 year old Caucasian female who is here for scheduled visit.  She was last seen on 01/18/2021. ?She has history of reflux esophagitis complicated by esophageal stricture which was last manipulated 1 year ago. ?She also has history of sigmoid diverticulitis.  Last colonoscopy was in August 2015. ? ?She was doing well on her last visit.  I recommended taking PPI every other day.  She was also given prescription for Cipro and metronidazole so that it is available in case she has another episode. ? ?She also has history of fatty liver but her transaminases have been normal. ? ?She says she is doing well.  She has not had any episode of abdominal pain or diverticulitis.  She is taking pantoprazole 3 times a week.  She says she is having chest pain every 2 months or so.  It is usually relieved with aspirin and/or Tylenol.  Last episode occurred few weeks ago lasting for 15 minutes.  She did not experience shortness of breath or lightheadedness.  She does not have heartburn often.  She has been able to swallow without any difficulty since her esophageal dilation 1 year ago.  She may have occasional feeling of lump in her throat.  Her appetite is good.  She has gained 3 pounds.  Her bowels move daily.  She denies melena or rectal bleeding.  She eats her supper at 5 PM and does not go to bed until after 9 PM. ? ? ?Current Medications: ?Outpatient Encounter Medications as of 07/19/2021  ?Medication Sig  ? Acetaminophen (TYLENOL EXTRA STRENGTH PO) Take by mouth. 54m one daily as needed.  ? Calcium Carbonate-Vitamin D (CALCIUM-VITAMIN D3 PO) Take 1 tablet by mouth. One bid  ? Glucosamine HCl (GLUCOSAMINE PO) Take 2 tablets by mouth daily in the afternoon.  ? levothyroxine (SYNTHROID, LEVOTHROID) 100 MCG tablet Take 100 mcg by mouth daily before breakfast.  ? Multiple Vitamin (MULTIVITAMIN WITH  MINERALS) TABS tablet Take 1 tablet by mouth daily in the afternoon.  ? pantoprazole (PROTONIX) 40 MG tablet Take 1 tablet (40 mg total) by mouth daily.  ? [DISCONTINUED] acetaminophen (TYLENOL) 325 MG tablet Take 325-650 mg by mouth every 6 (six) hours as needed for moderate pain or headache.  ? ciprofloxacin (CIPRO) 500 MG tablet Take 1 tablet (500 mg total) by mouth 2 (two) times daily. (Patient not taking: Reported on 07/19/2021)  ? metroNIDAZOLE (FLAGYL) 500 MG tablet Take 1 tablet (500 mg total) by mouth 2 (two) times daily. (Patient not taking: Reported on 07/19/2021)  ? [DISCONTINUED] psyllium (HYDROCIL/METAMUCIL) 95 % PACK Take 1 packet by mouth daily. (Patient not taking: Reported on 07/19/2021)  ? ?No facility-administered encounter medications on file as of 07/19/2021.  ? ? ? ?Objective: ?Blood pressure 118/72, pulse 66, temperature 98.7 ?F (37.1 ?C), temperature source Oral, height _0  (1.702 m), weight 162 lb 3.2 oz (73.6 kg). ?Patient is alert and in no acute distress. ?Conjunctiva is pink. Sclera is nonicteric ?Oropharyngeal mucosa is normal. ?No neck masses or thyromegaly noted. ?Cardiac exam with regular rhythm normal S1 and S2. No murmur or gallop noted. ?Lungs are clear to auscultation. ?Abdomen is symmetrical soft and nontender with organomegaly or masses. ?No LE edema or clubbing noted. ? ?Labs/studies Results: ? ? ?CBC Latest Ref Rng & Units 10/19/2020 08/04/2015 08/03/2015  ?WBC 4.0 - 10.5 K/uL 11.0(H) 5.9 5.6  ?Hemoglobin 12.0 - 15.0 g/dL 15.2(H) 13.0 12.1  ?  Hematocrit 36.0 - 46.0 % 45.9 37.2 36.4  ?Platelets 150 - 400 K/uL 203 160 144(L)  ?  ?CMP Latest Ref Rng & Units 10/19/2020 09/23/2020 06/18/2020  ?Glucose 70 - 99 mg/dL 121(H) - -  ?BUN 8 - 23 mg/dL 13 - -  ?Creatinine 0.44 - 1.00 mg/dL 0.83 - -  ?Sodium 135 - 145 mmol/L 137 - -  ?Potassium 3.5 - 5.1 mmol/L 3.7 - -  ?Chloride 98 - 111 mmol/L 103 - -  ?CO2 22 - 32 mmol/L 24 - -  ?Calcium 8.9 - 10.3 mg/dL 9.6 - -  ?Total Protein 6.5 - 8.1 g/dL  7.9 7.1 7.3  ?Total Bilirubin 0.3 - 1.2 mg/dL 0.8 0.6 0.6  ?Alkaline Phos 38 - 126 U/L 73 - -  ?AST 15 - 41 U/L _0 ?ALT 0 - 44 U/L 31 29 30(H)  ?  ?Hepatic Function Latest Ref Rng & Units 10/19/2020 09/23/2020 06/18/2020  ?Total Protein 6.5 - 8.1 g/dL 7.9 7.1 7.3  ?Albumin 3.5 - 5.0 g/dL 4.4 - -  ?AST 15 - 41 U/L _1 ?ALT 0 - 44 U/L 31 29 30(H)  ?Alk Phosphatase 38 - 126 U/L 73 - -  ?Total Bilirubin 0.3 - 1.2 mg/dL 0.8 0.6 0.6  ?Bilirubin, Direct 0.0 - 0.2 mg/dL - 0.1 0.1  ?  ?Above lab data reviewed. ? ?Assessment: ? ?#1.  Chronic GERD complicated by distal esophageal stricture which was last dilated 1 year ago.  She is having intermittent chest pain while on every other day PPI.  She does not have any associated symptoms suggest cardiac disease.  I would recommend taking PPI every day.  If chest pain recurs she should follow-up with Dr. Quillian Quince for cardiac evaluation. ? ?#2.  Esophageal stricture.  It was dilated 1 year ago.  She is not having any swallowing difficulty. ? ?#3.  History of diverticulitis.  She has had 4-5 episodes.  She has not had an episode since her last visit.  She has Cipro and metronidazole on hand just in case. ? ?Plan: ? ?Take pantoprazole 40 mg by mouth 30 minutes before breakfast daily. ?Continue antireflux measures. ?Patient will keep symptom diary as to frequency of chest pain heartburn and lump in her throat for the next 2 months and call us with progress report. ?Office visit in 6 months. ? ? ? ? ? ?

## 2021-07-29 DIAGNOSIS — Z1231 Encounter for screening mammogram for malignant neoplasm of breast: Secondary | ICD-10-CM | POA: Diagnosis not present

## 2021-07-30 IMAGING — CT CT ABD-PELV W/ CM
2 of 5 series · 17 of 46 positions shown, 19 images · IV contrast (Omnipaque or Isovue)
Comparison: 07/31/2015

CLINICAL DATA: Lower abdominal and pelvic pain for 1 day.  Nausea.

EXAM:
CT ABDOMEN AND PELVIS WITH CONTRAST
TECHNIQUE: Multidetector CT imaging of the abdomen and pelvis was performed
using the standard protocol following bolus administration of
intravenous contrast.
CONTRAST:  100mL OMNIPAQUE IOHEXOL 300 MG/ML  SOLN

[Series 2: axial st · axial · 0.83mm/px · z∈[+832,+1216]mm · 14 of 89 slices shown, 16 images]
[im 6/89  soft-tissue]
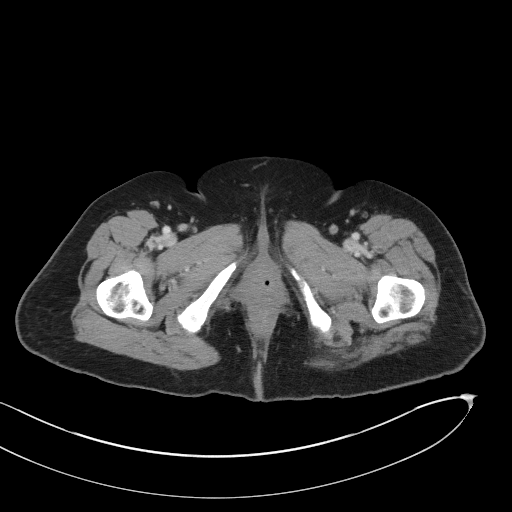
[im 6/89  bone]
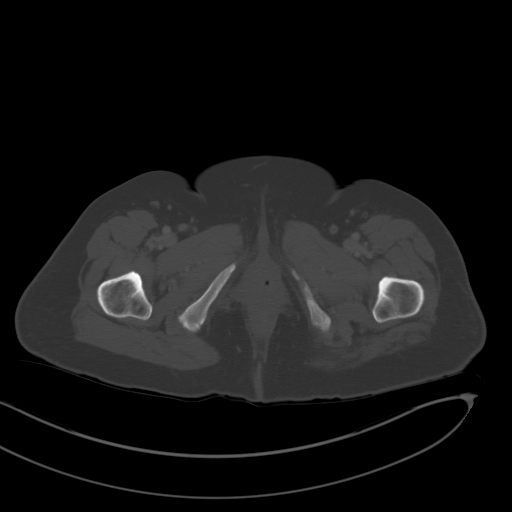
[im 12/89  soft-tissue]
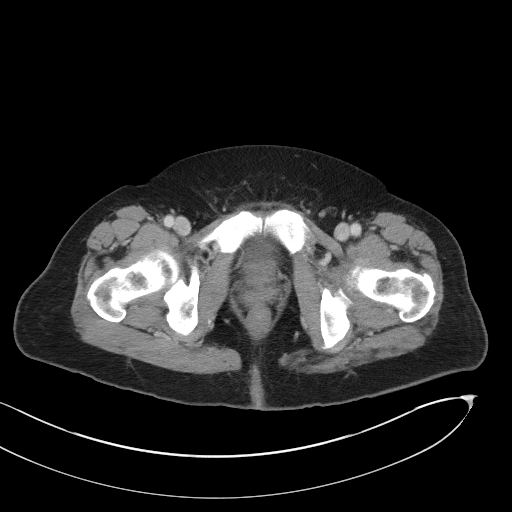
[im 17/89  soft-tissue]
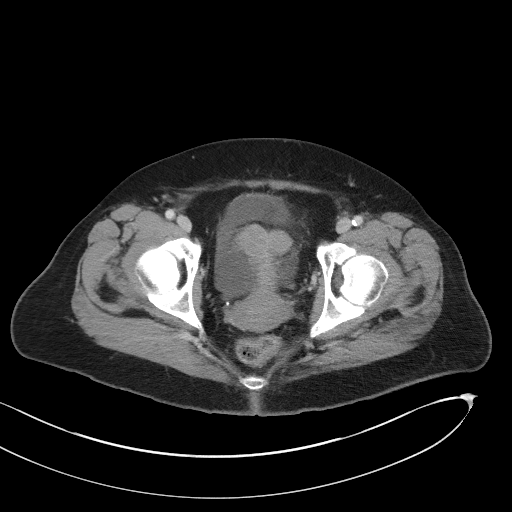
[im 23/89  soft-tissue]
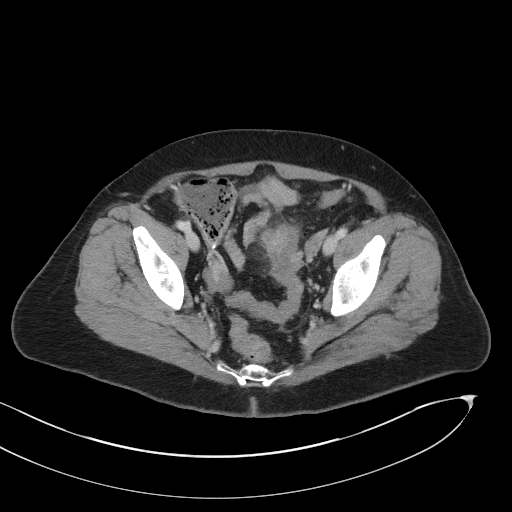
[im 28/89  soft-tissue]
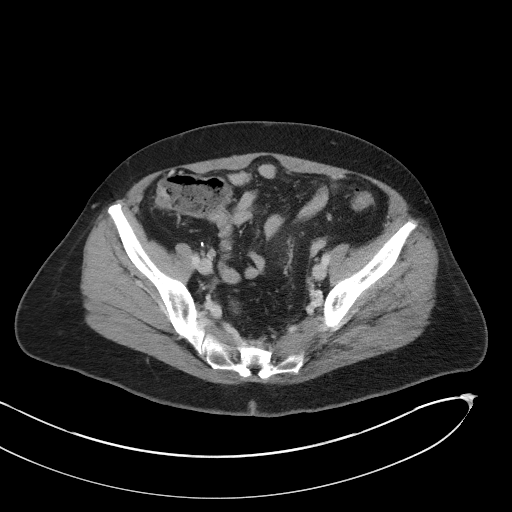
[im 34/89  soft-tissue]
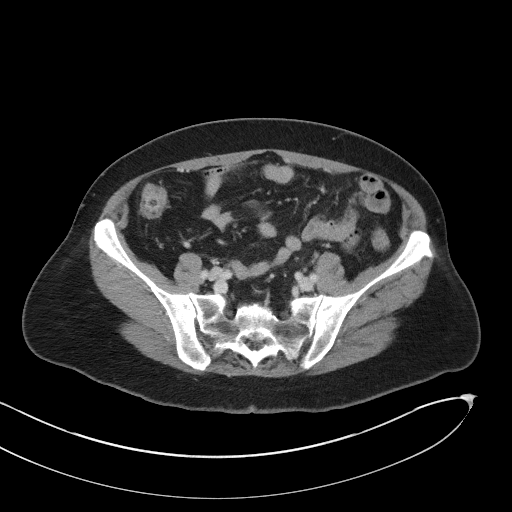
[im 39/89  soft-tissue]
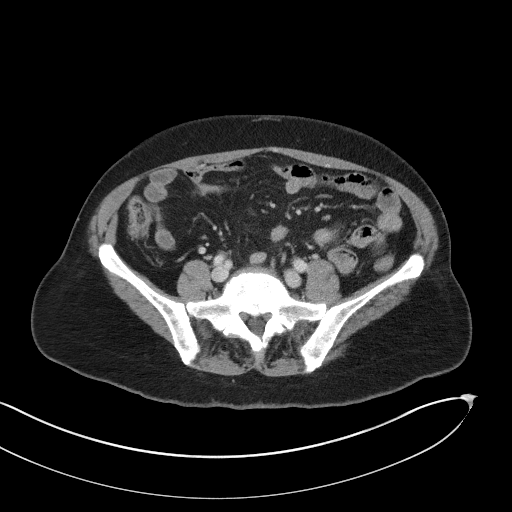
[im 50/89  soft-tissue]
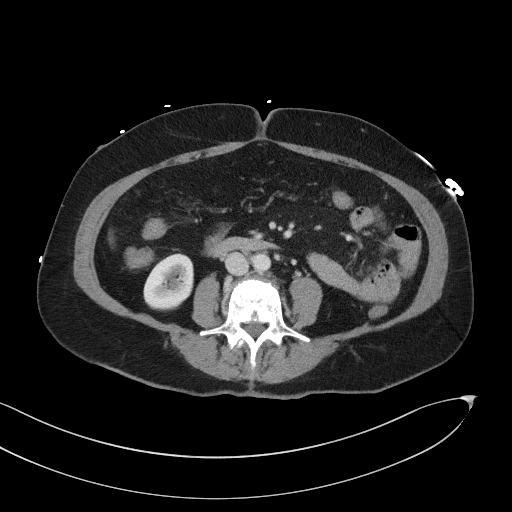
[im 56/89  soft-tissue]
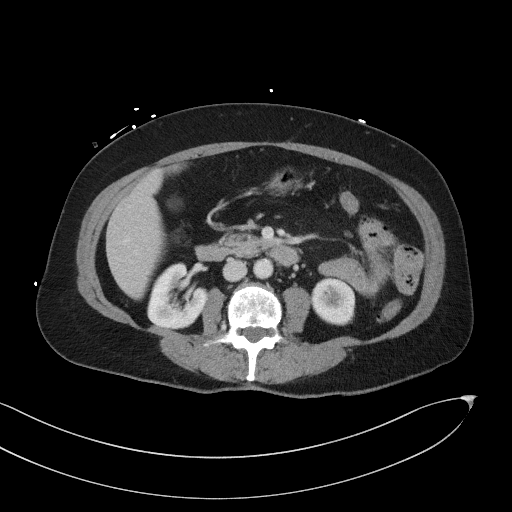
[im 56/89  bone]
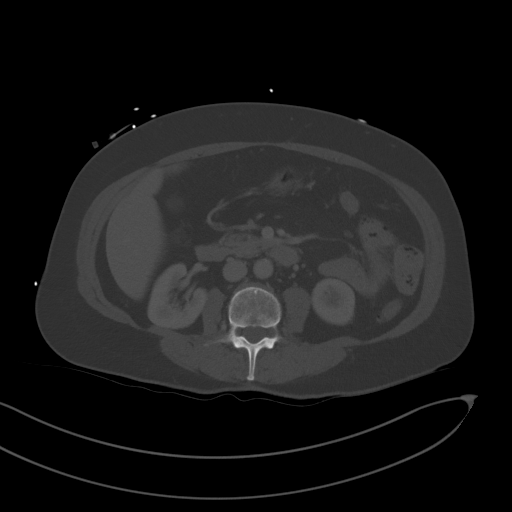
[im 61/89  soft-tissue]
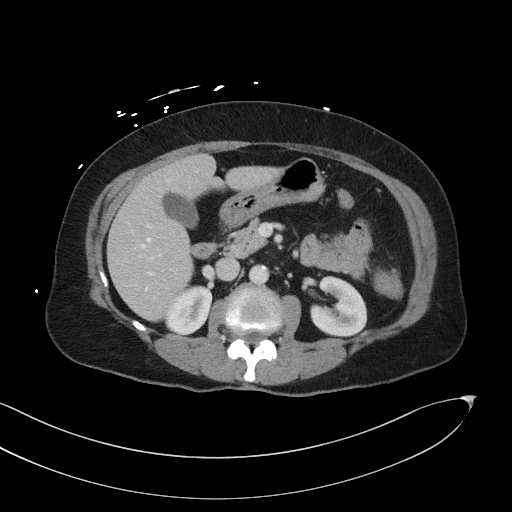
[im 67/89  soft-tissue]
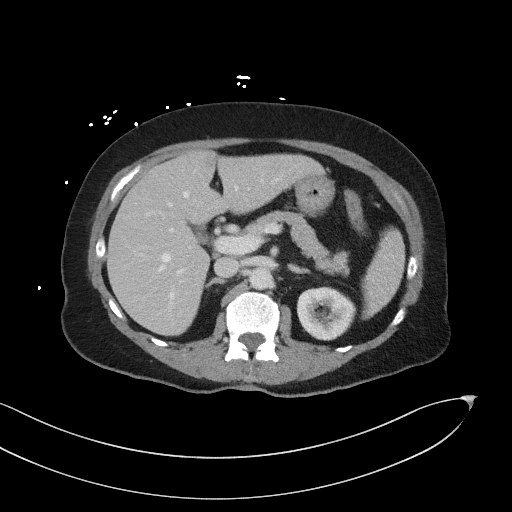
[im 72/89  soft-tissue]
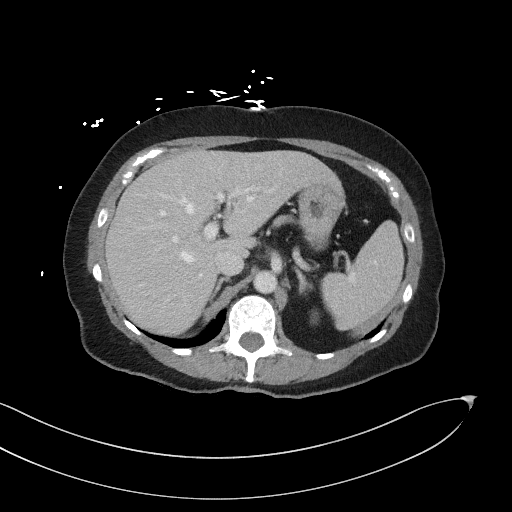
[im 78/89  soft-tissue]
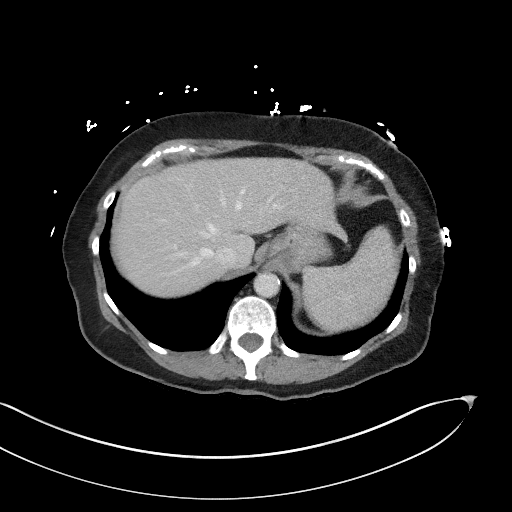
[im 83/89  soft-tissue]
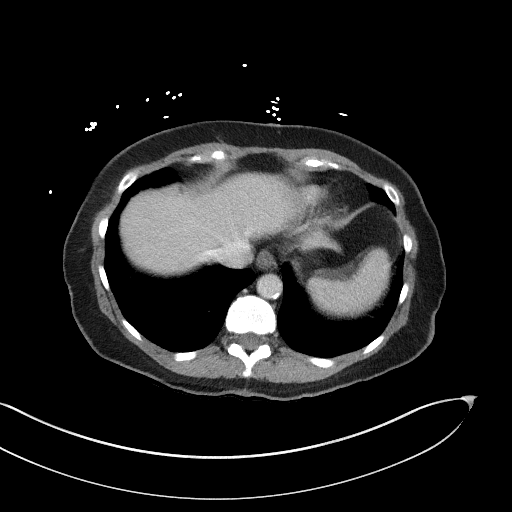

[Series 5: coronal st · coronal · 0.78mm/px · 3 of 100 slices shown]
[im 34/100  soft-tissue]
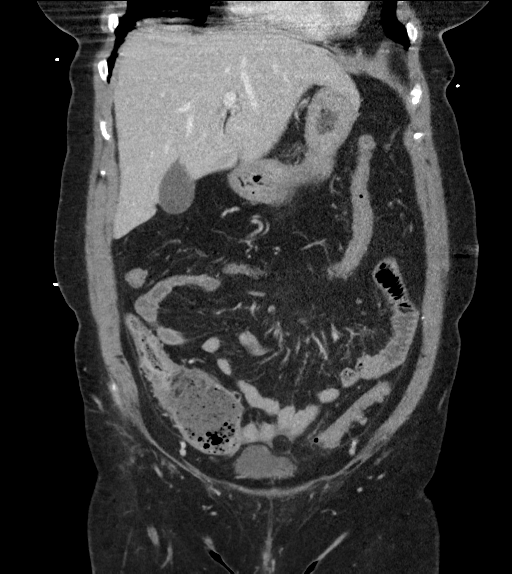
[im 45/100  soft-tissue]
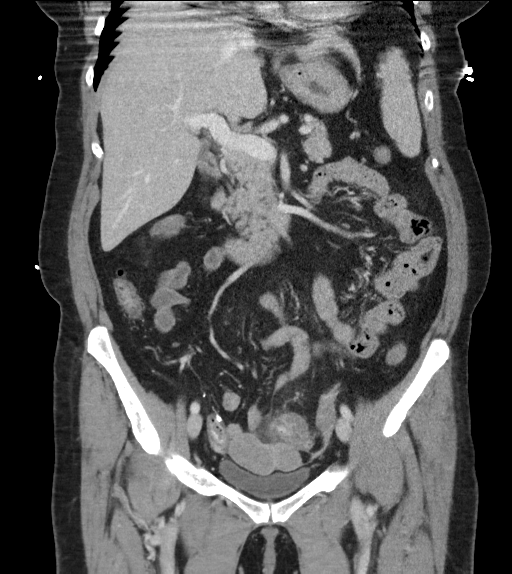
[im 56/100  soft-tissue]
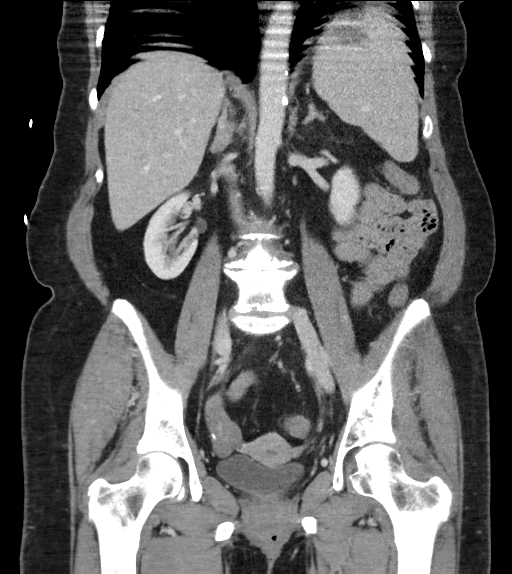

[17 of 46 positions shown; findings below may reference images not displayed]

FINDINGS: Lower Chest: No acute findings.

Hepatobiliary: No hepatic masses identified. Gallbladder is
unremarkable. No evidence of biliary ductal dilatation.

Pancreas:  No mass or inflammatory changes.

Spleen: Within normal limits in size and appearance.

Adrenals/Urinary Tract: No masses identified. No evidence of
ureteral calculi or hydronephrosis.

Stomach/Bowel: Mild diverticulitis is seen involving the sigmoid
colon. No evidence of abscess, bowel obstruction, or free
intraperitoneal air.

Vascular/Lymphatic: No pathologically enlarged lymph nodes. No acute
vascular findings.

Reproductive: 2 subserosal fibroids are seen in the uterine fundus,
largest measuring 3.3 cm, without significant change since prior
study. Adnexal regions are unremarkable.

Other:  None.

Musculoskeletal:  No suspicious bone lesions identified.
IMPRESSION: Mild sigmoid diverticulitis. No evidence of abscess or other
complication.

Stable small uterine fibroids.

## 2021-08-02 DIAGNOSIS — K5730 Diverticulosis of large intestine without perforation or abscess without bleeding: Secondary | ICD-10-CM | POA: Diagnosis not present

## 2021-08-02 DIAGNOSIS — K219 Gastro-esophageal reflux disease without esophagitis: Secondary | ICD-10-CM | POA: Diagnosis not present

## 2021-08-02 DIAGNOSIS — E039 Hypothyroidism, unspecified: Secondary | ICD-10-CM | POA: Diagnosis not present

## 2021-08-09 DIAGNOSIS — K7581 Nonalcoholic steatohepatitis (NASH): Secondary | ICD-10-CM | POA: Diagnosis not present

## 2021-08-09 DIAGNOSIS — Z6825 Body mass index (BMI) 25.0-25.9, adult: Secondary | ICD-10-CM | POA: Diagnosis not present

## 2021-08-09 DIAGNOSIS — E039 Hypothyroidism, unspecified: Secondary | ICD-10-CM | POA: Diagnosis not present

## 2021-08-09 DIAGNOSIS — M8580 Other specified disorders of bone density and structure, unspecified site: Secondary | ICD-10-CM | POA: Diagnosis not present

## 2021-08-09 DIAGNOSIS — K5730 Diverticulosis of large intestine without perforation or abscess without bleeding: Secondary | ICD-10-CM | POA: Diagnosis not present

## 2021-08-09 DIAGNOSIS — E7849 Other hyperlipidemia: Secondary | ICD-10-CM | POA: Diagnosis not present

## 2021-08-11 ENCOUNTER — Other Ambulatory Visit (INDEPENDENT_AMBULATORY_CARE_PROVIDER_SITE_OTHER): Payer: Self-pay | Admitting: Internal Medicine

## 2021-08-11 NOTE — Telephone Encounter (Signed)
Patient states she does not need as of right now. She has plenty. ?

## 2021-08-18 DIAGNOSIS — R21 Rash and other nonspecific skin eruption: Secondary | ICD-10-CM | POA: Diagnosis not present

## 2021-08-18 DIAGNOSIS — W57XXXA Bitten or stung by nonvenomous insect and other nonvenomous arthropods, initial encounter: Secondary | ICD-10-CM | POA: Diagnosis not present

## 2021-09-11 DIAGNOSIS — R21 Rash and other nonspecific skin eruption: Secondary | ICD-10-CM | POA: Diagnosis not present

## 2021-09-11 DIAGNOSIS — W57XXXA Bitten or stung by nonvenomous insect and other nonvenomous arthropods, initial encounter: Secondary | ICD-10-CM | POA: Diagnosis not present

## 2021-09-11 DIAGNOSIS — T7840XA Allergy, unspecified, initial encounter: Secondary | ICD-10-CM | POA: Diagnosis not present

## 2021-09-11 DIAGNOSIS — S60465A Insect bite (nonvenomous) of left ring finger, initial encounter: Secondary | ICD-10-CM | POA: Diagnosis not present

## 2021-09-11 DIAGNOSIS — Z609 Problem related to social environment, unspecified: Secondary | ICD-10-CM | POA: Diagnosis not present

## 2021-09-14 DIAGNOSIS — L309 Dermatitis, unspecified: Secondary | ICD-10-CM | POA: Diagnosis not present

## 2021-09-14 DIAGNOSIS — L501 Idiopathic urticaria: Secondary | ICD-10-CM | POA: Diagnosis not present

## 2021-10-12 DIAGNOSIS — D2271 Melanocytic nevi of right lower limb, including hip: Secondary | ICD-10-CM | POA: Diagnosis not present

## 2021-10-12 DIAGNOSIS — Z1283 Encounter for screening for malignant neoplasm of skin: Secondary | ICD-10-CM | POA: Diagnosis not present

## 2021-10-12 DIAGNOSIS — L661 Lichen planopilaris: Secondary | ICD-10-CM | POA: Diagnosis not present

## 2021-10-12 DIAGNOSIS — D485 Neoplasm of uncertain behavior of skin: Secondary | ICD-10-CM | POA: Diagnosis not present

## 2021-10-12 DIAGNOSIS — D2272 Melanocytic nevi of left lower limb, including hip: Secondary | ICD-10-CM | POA: Diagnosis not present

## 2021-11-02 ENCOUNTER — Other Ambulatory Visit (INDEPENDENT_AMBULATORY_CARE_PROVIDER_SITE_OTHER): Payer: Self-pay | Admitting: Internal Medicine

## 2021-11-03 DIAGNOSIS — Z6825 Body mass index (BMI) 25.0-25.9, adult: Secondary | ICD-10-CM | POA: Diagnosis not present

## 2021-11-03 DIAGNOSIS — M65319 Trigger thumb, unspecified thumb: Secondary | ICD-10-CM | POA: Diagnosis not present

## 2021-11-17 DIAGNOSIS — M65319 Trigger thumb, unspecified thumb: Secondary | ICD-10-CM | POA: Diagnosis not present

## 2021-11-17 DIAGNOSIS — Z6826 Body mass index (BMI) 26.0-26.9, adult: Secondary | ICD-10-CM | POA: Diagnosis not present

## 2021-11-17 DIAGNOSIS — R030 Elevated blood-pressure reading, without diagnosis of hypertension: Secondary | ICD-10-CM | POA: Diagnosis not present

## 2021-11-17 DIAGNOSIS — M545 Low back pain, unspecified: Secondary | ICD-10-CM | POA: Diagnosis not present

## 2021-11-17 DIAGNOSIS — M79643 Pain in unspecified hand: Secondary | ICD-10-CM | POA: Diagnosis not present

## 2021-12-07 DIAGNOSIS — H5200 Hypermetropia, unspecified eye: Secondary | ICD-10-CM | POA: Diagnosis not present

## 2021-12-07 DIAGNOSIS — Z0100 Encounter for examination of eyes and vision without abnormal findings: Secondary | ICD-10-CM | POA: Diagnosis not present

## 2022-02-06 DIAGNOSIS — Z131 Encounter for screening for diabetes mellitus: Secondary | ICD-10-CM | POA: Diagnosis not present

## 2022-02-06 DIAGNOSIS — E7849 Other hyperlipidemia: Secondary | ICD-10-CM | POA: Diagnosis not present

## 2022-02-06 DIAGNOSIS — K7581 Nonalcoholic steatohepatitis (NASH): Secondary | ICD-10-CM | POA: Diagnosis not present

## 2022-02-06 DIAGNOSIS — K219 Gastro-esophageal reflux disease without esophagitis: Secondary | ICD-10-CM | POA: Diagnosis not present

## 2022-02-06 DIAGNOSIS — Z0001 Encounter for general adult medical examination with abnormal findings: Secondary | ICD-10-CM | POA: Diagnosis not present

## 2022-02-06 DIAGNOSIS — E782 Mixed hyperlipidemia: Secondary | ICD-10-CM | POA: Diagnosis not present

## 2022-02-06 DIAGNOSIS — E039 Hypothyroidism, unspecified: Secondary | ICD-10-CM | POA: Diagnosis not present

## 2022-02-06 DIAGNOSIS — K760 Fatty (change of) liver, not elsewhere classified: Secondary | ICD-10-CM | POA: Diagnosis not present

## 2022-02-13 DIAGNOSIS — E039 Hypothyroidism, unspecified: Secondary | ICD-10-CM | POA: Diagnosis not present

## 2022-02-13 DIAGNOSIS — Z23 Encounter for immunization: Secondary | ICD-10-CM | POA: Diagnosis not present

## 2022-02-13 DIAGNOSIS — Z6825 Body mass index (BMI) 25.0-25.9, adult: Secondary | ICD-10-CM | POA: Diagnosis not present

## 2022-02-13 DIAGNOSIS — K2100 Gastro-esophageal reflux disease with esophagitis, without bleeding: Secondary | ICD-10-CM | POA: Diagnosis not present

## 2022-02-13 DIAGNOSIS — K7581 Nonalcoholic steatohepatitis (NASH): Secondary | ICD-10-CM | POA: Diagnosis not present

## 2022-02-13 DIAGNOSIS — M8580 Other specified disorders of bone density and structure, unspecified site: Secondary | ICD-10-CM | POA: Diagnosis not present

## 2022-02-13 DIAGNOSIS — Z0001 Encounter for general adult medical examination with abnormal findings: Secondary | ICD-10-CM | POA: Diagnosis not present

## 2022-02-13 DIAGNOSIS — K5730 Diverticulosis of large intestine without perforation or abscess without bleeding: Secondary | ICD-10-CM | POA: Diagnosis not present

## 2022-02-13 DIAGNOSIS — E7849 Other hyperlipidemia: Secondary | ICD-10-CM | POA: Diagnosis not present

## 2022-02-24 ENCOUNTER — Observation Stay (HOSPITAL_COMMUNITY)
Admission: EM | Admit: 2022-02-24 | Discharge: 2022-02-25 | Disposition: A | Discharge status: Home / Self Care | Payer: Medicare HMO | Attending: Family Medicine | Admitting: Family Medicine

## 2022-02-24 ENCOUNTER — Emergency Department (HOSPITAL_COMMUNITY): Payer: Medicare HMO

## 2022-02-24 ENCOUNTER — Other Ambulatory Visit: Payer: Self-pay

## 2022-02-24 ENCOUNTER — Encounter (HOSPITAL_COMMUNITY): Payer: Self-pay | Admitting: Emergency Medicine

## 2022-02-24 DIAGNOSIS — R29818 Other symptoms and signs involving the nervous system: Secondary | ICD-10-CM | POA: Diagnosis not present

## 2022-02-24 DIAGNOSIS — I6782 Cerebral ischemia: Secondary | ICD-10-CM | POA: Diagnosis not present

## 2022-02-24 DIAGNOSIS — R200 Anesthesia of skin: Principal | ICD-10-CM | POA: Insufficient documentation

## 2022-02-24 DIAGNOSIS — J3489 Other specified disorders of nose and nasal sinuses: Secondary | ICD-10-CM | POA: Diagnosis not present

## 2022-02-24 DIAGNOSIS — Z7722 Contact with and (suspected) exposure to environmental tobacco smoke (acute) (chronic): Secondary | ICD-10-CM | POA: Insufficient documentation

## 2022-02-24 DIAGNOSIS — K219 Gastro-esophageal reflux disease without esophagitis: Secondary | ICD-10-CM | POA: Diagnosis present

## 2022-02-24 DIAGNOSIS — Z79899 Other long term (current) drug therapy: Secondary | ICD-10-CM | POA: Insufficient documentation

## 2022-02-24 DIAGNOSIS — E039 Hypothyroidism, unspecified: Secondary | ICD-10-CM | POA: Insufficient documentation

## 2022-02-24 DIAGNOSIS — G459 Transient cerebral ischemic attack, unspecified: Secondary | ICD-10-CM | POA: Diagnosis not present

## 2022-02-24 DIAGNOSIS — R9431 Abnormal electrocardiogram [ECG] [EKG]: Secondary | ICD-10-CM | POA: Diagnosis not present

## 2022-02-24 DIAGNOSIS — I639 Cerebral infarction, unspecified: Secondary | ICD-10-CM | POA: Diagnosis not present

## 2022-02-24 DIAGNOSIS — R202 Paresthesia of skin: Secondary | ICD-10-CM | POA: Insufficient documentation

## 2022-02-24 DIAGNOSIS — R519 Headache, unspecified: Secondary | ICD-10-CM | POA: Diagnosis not present

## 2022-02-24 LAB — DIFFERENTIAL
Abs Immature Granulocytes: 0.01 10*3/uL (ref 0.00–0.07)
Basophils Absolute: 0 10*3/uL (ref 0.0–0.1)
Basophils Relative: 1 %
Eosinophils Absolute: 0.2 10*3/uL (ref 0.0–0.5)
Eosinophils Relative: 4 %
Immature Granulocytes: 0 %
Lymphocytes Relative: 32 %
Lymphs Abs: 1.7 10*3/uL (ref 0.7–4.0)
Monocytes Absolute: 0.5 10*3/uL (ref 0.1–1.0)
Monocytes Relative: 8 %
Neutro Abs: 3 10*3/uL (ref 1.7–7.7)
Neutrophils Relative %: 55 %

## 2022-02-24 LAB — COMPREHENSIVE METABOLIC PANEL
ALT: 50 U/L — ABNORMAL HIGH (ref 0–44)
AST: 34 U/L (ref 15–41)
Albumin: 4.3 g/dL (ref 3.5–5.0)
Alkaline Phosphatase: 70 U/L (ref 38–126)
Anion gap: 8 (ref 5–15)
BUN: 23 mg/dL (ref 8–23)
CO2: 23 mmol/L (ref 22–32)
Calcium: 9.3 mg/dL (ref 8.9–10.3)
Chloride: 109 mmol/L (ref 98–111)
Creatinine, Ser: 0.91 mg/dL (ref 0.44–1.00)
GFR, Estimated: >60 mL/min (ref >60)
Glucose, Bld: 112 mg/dL — ABNORMAL HIGH (ref 70–99)
Potassium: 3.9 mmol/L (ref 3.5–5.1)
Sodium: 140 mmol/L (ref 135–145)
Total Bilirubin: 0.7 mg/dL (ref 0.3–1.2)
Total Protein: 7.6 g/dL (ref 6.5–8.1)

## 2022-02-24 LAB — CBC
HCT: 44.8 % (ref 36.0–46.0)
Hemoglobin: 15.2 g/dL — ABNORMAL HIGH (ref 12.0–15.0)
MCH: 29.4 pg (ref 26.0–34.0)
MCHC: 33.9 g/dL (ref 30.0–36.0)
MCV: 86.7 fL (ref 80.0–100.0)
Platelets: 189 10*3/uL (ref 150–400)
RBC: 5.17 MIL/uL — ABNORMAL HIGH (ref 3.87–5.11)
RDW: 13.3 % (ref 11.5–15.5)
WBC: 5.4 10*3/uL (ref 4.0–10.5)
nRBC: 0 % (ref 0.0–0.2)

## 2022-02-24 LAB — ETHANOL: Alcohol, Ethyl (B): <10 mg/dL (ref <10)

## 2022-02-24 LAB — CBG MONITORING, ED: Glucose-Capillary: 117 mg/dL — ABNORMAL HIGH (ref 70–99)

## 2022-02-24 LAB — APTT: aPTT: 31 seconds (ref 24–36)

## 2022-02-24 LAB — PROTIME-INR
INR: 1 (ref 0.8–1.2)
Prothrombin Time: 13.3 seconds (ref 11.4–15.2)

## 2022-02-24 MED ORDER — SENNOSIDES-DOCUSATE SODIUM 8.6-50 MG PO TABS: 1.0000 | ORAL_TABLET | Freq: Every evening | ORAL | Status: DC | PRN

## 2022-02-24 MED ORDER — ACETAMINOPHEN 650 MG RE SUPP: 650.0000 mg | RECTAL | Status: DC | PRN

## 2022-02-24 MED ORDER — ASPIRIN 325 MG PO TABS
325.0000 mg | ORAL_TABLET | Freq: Every day | ORAL | Status: DC
  Administered 2022-02-25: 325 mg via ORAL
  Filled 2022-02-24: qty 1

## 2022-02-24 MED ORDER — ASPIRIN 300 MG RE SUPP: 300.0000 mg | Freq: Every day | RECTAL | Status: DC

## 2022-02-24 MED ORDER — STROKE: EARLY STAGES OF RECOVERY BOOK
Freq: Once | Status: AC
  Filled 2022-02-24: qty 1

## 2022-02-24 MED ORDER — ACETAMINOPHEN 160 MG/5ML PO SOLN: 650.0000 mg | ORAL | Status: DC | PRN

## 2022-02-24 MED ORDER — LEVOTHYROXINE SODIUM 100 MCG PO TABS
100.0000 ug | ORAL_TABLET | Freq: Every day | ORAL | Status: DC
  Administered 2022-02-25: 100 ug via ORAL
  Filled 2022-02-24: qty 1

## 2022-02-24 MED ORDER — LORAZEPAM 2 MG/ML IJ SOLN: 0.5000 mg | Freq: Once | INTRAMUSCULAR | Status: DC | PRN

## 2022-02-24 MED ORDER — SODIUM CHLORIDE 0.9% FLUSH: 3.0000 mL | Freq: Once | INTRAVENOUS | Status: DC

## 2022-02-24 MED ORDER — ENOXAPARIN SODIUM 40 MG/0.4ML IJ SOSY
40.0000 mg | PREFILLED_SYRINGE | INTRAMUSCULAR | Status: DC
  Administered 2022-02-24: 40 mg via SUBCUTANEOUS
  Filled 2022-02-24: qty 0.4

## 2022-02-24 MED ORDER — ACETAMINOPHEN 325 MG PO TABS
650.0000 mg | ORAL_TABLET | ORAL | Status: DC | PRN
  Administered 2022-02-25: 650 mg via ORAL
  Filled 2022-02-24: qty 2

## 2022-02-24 MED ORDER — PANTOPRAZOLE SODIUM 40 MG PO TBEC
40.0000 mg | DELAYED_RELEASE_TABLET | Freq: Two times a day (BID) | ORAL | Status: DC
  Administered 2022-02-24 – 2022-02-25 (×2): 40 mg via ORAL
  Filled 2022-02-24 (×2): qty 1

## 2022-02-24 MED ORDER — IOHEXOL 350 MG/ML SOLN
100.0000 mL | Freq: Once | INTRAVENOUS | Status: AC | PRN
  Administered 2022-02-24: 100 mL via INTRAVENOUS

## 2022-02-24 NOTE — ED Provider Notes (Signed)
[facility] EMERGENCY DEPARTMENT Provider Note  CSN: [id number] Arrival date & time: 02/24/22 1450  Chief Complaint(s) No chief complaint on file.  HPI Desiree Flores is a 76 y.o. female 76 year old female with history of hypothyroidism presenting to the emergency department with numbness to the left hand.  She reports that she had tingling and numbness in her left hand in her middle, index, thumb fingers.  She reports that this began around 2:15 PM and lasted around 20-30 minutes.  She reports currently she has no numbness or weakness.  Her husband drove her to the emergency department.  She also reports that she has had intermittent headaches for the past 2 weeks, improved with Tylenol, and vague visual disturbance described as "lines "in her vision in the left eye, not currently experiencing this phenomenon.  She denies any facial droop, speech difficulties, dizziness, fainting, chest pain or shortness of breath.  Symptoms are currently resolved.   Past Medical History Past Medical History:  Diagnosis Date   Diverticulitis    Hypothyroidism    Patient Active Problem List   Diagnosis Date Noted   History of diverticulitis 01/18/2021   ALT (SGPT) level raised 06/03/2020   Esophageal dysphagia 08/21/2017   Dysphagia 08/03/2015   Esophageal stricture 08/03/2015   Pancreatitis, acute    Duodenitis    Pancreatitis 07/31/2015   GERD (gastroesophageal reflux disease) 07/31/2015   Hypothyroidism 07/31/2015   Diverticulitis, duodenum    Epigastric abdominal pain    Unspecified hypothyroidism 12/15/2013   Diverticulitis 12/15/2013   Home Medication(s) Prior to Admission medications   Medication Sig Start Date End Date Taking? Authorizing Provider  Acetaminophen (TYLENOL EXTRA STRENGTH PO) Take by mouth. '500mg'$  one daily as needed.    [provider]  Calcium Carbonate-Vitamin D (CALCIUM-VITAMIN D3 PO) Take 1 tablet by mouth. One bid    [provider]   ciprofloxacin (CIPRO) 500 MG tablet Take 1 tablet (500 mg total) by mouth 2 (two) times daily. Patient not taking: Reported on 07/19/2021 01/18/21   Rogene Houston, MD  Glucosamine HCl (GLUCOSAMINE PO) Take 2 tablets by mouth daily in the afternoon.    [provider]  levothyroxine (SYNTHROID, LEVOTHROID) 100 MCG tablet Take 100 mcg by mouth daily before breakfast.    [provider]  metroNIDAZOLE (FLAGYL) 500 MG tablet Take 1 tablet (500 mg total) by mouth 2 (two) times daily. Patient not taking: Reported on 07/19/2021 01/18/21   Rogene Houston, MD  Multiple Vitamin (MULTIVITAMIN WITH MINERALS) TABS tablet Take 1 tablet by mouth daily in the afternoon.    [provider]  pantoprazole (PROTONIX) 40 MG tablet TAKE 1 TABLET (40 MG TOTAL) BY MOUTH TWICE A DAY BEFORE MEALS 11/03/21   Rehman, Mechele Dawley, MD  Past Surgical History Past Surgical History:  Procedure Laterality Date   APPENDECTOMY     6-7 yrs ago   COLONOSCOPY     COLONOSCOPY N/A 01/28/2014   Procedure: COLONOSCOPY;  Surgeon: Rogene Houston, MD;  Location: [facility];  Service: Endoscopy;  Laterality: N/A;  200   DILATION AND CURETTAGE OF UTERUS     miscarriage   ESOPHAGEAL DILATION  08/03/2015   Procedure: ESOPHAGEAL DILATION;  Surgeon: Rogene Houston, MD;  Location: [facility];  Service: Endoscopy;;   ESOPHAGEAL DILATION N/A 09/19/2017   Procedure: ESOPHAGEAL DILATION;  Surgeon: Rogene Houston, MD;  Location: [facility];  Service: Endoscopy;  Laterality: N/A;   ESOPHAGEAL DILATION N/A 07/15/2020   Procedure: ESOPHAGEAL DILATION;  Surgeon: Rogene Houston, MD;  Location: [facility];  Service: Endoscopy;  Laterality: N/A;   ESOPHAGOGASTRODUODENOSCOPY N/A 08/03/2015   Procedure: ESOPHAGOGASTRODUODENOSCOPY (EGD);  Surgeon: Rogene Houston, MD;  [facility]: [facility];  Service: Endoscopy;  Laterality: N/A;   ESOPHAGOGASTRODUODENOSCOPY N/A 09/19/2017   Procedure: ESOPHAGOGASTRODUODENOSCOPY (EGD);  Surgeon: Rogene Houston, MD;  Location: [facility];  Service: Endoscopy;  Laterality: N/A;  12:40-rescheduled to 5/15 @ 7:30am per Lelon Frohlich   ESOPHAGOGASTRODUODENOSCOPY N/A 07/15/2020   Procedure: ESOPHAGOGASTRODUODENOSCOPY (EGD);  Surgeon: Rogene Houston, MD;  Location: [facility];  Service: Endoscopy;  Laterality: N/A;  AM   Family History Family History  Problem Relation Age of Onset   Colon cancer Other     Social History Social History   Tobacco Use   Smoking status: Never    Passive exposure: Past   Smokeless tobacco: Never  Vaping Use   Vaping Use: Never used  Substance Use Topics   Alcohol use: No   Drug use: No   Allergies Sulfa antibiotics  Review of Systems Review of Systems  All other systems reviewed and are negative.   Physical Exam Vital Signs  I have reviewed the triage vital signs BP 130/81   Pulse 84   Temp 98.2 F (36.8 C)   Resp 14   Ht '5\' 6"'$  (1.676 m)   Wt 68 kg   SpO2 98%   BMI 24.21 kg/m  Physical Exam Vitals and nursing note reviewed.  Constitutional:      General: She is not in acute distress.    Appearance: She is well-developed.  HENT:     Head: Normocephalic and atraumatic.     Mouth/Throat:     Mouth: Mucous membranes are moist.  Eyes:     Pupils: Pupils are equal, round, and reactive to light.  Cardiovascular:     Rate and Rhythm: Normal rate and regular rhythm.     Heart sounds: No murmur heard. Pulmonary:     Effort: Pulmonary effort is normal. No respiratory distress.     Breath sounds: Normal breath sounds.  Abdominal:     General: Abdomen is flat.     Palpations: Abdomen is soft.     Tenderness: There is no abdominal tenderness.  Musculoskeletal:        General: No tenderness.     Right lower leg: No edema.     Left lower leg: No edema.  Skin:    General: Skin is warm and  dry.  Neurological:     General: No focal deficit present.     Mental Status: She is alert. Mental status is at baseline.     Comments: Cranial nerves II through XII intact, strength 5 out  of 5 in the bilateral upper and lower extremities, no sensory deficit to light touch, no dysmetria on finger-nose-finger testing. No visual field deficits. Vision grossly normal  Psychiatric:        Mood and Affect: Mood normal.        Behavior: Behavior normal.      ED Results and Treatments Labs (all labs ordered are listed, but only abnormal results are displayed) Labs Reviewed  CBC - Abnormal; Notable for the following components:      Result Value   RBC 5.17 (*)    Hemoglobin 15.2 (*)    All other components within normal limits  COMPREHENSIVE METABOLIC PANEL - Abnormal; Notable for the following components:   Glucose, Bld 112 (*)    ALT 50 (*)    All other components within normal limits  CBG MONITORING, ED - Abnormal; Notable for the following components:   Glucose-Capillary 117 (*)    All other components within normal limits  PROTIME-INR  APTT  DIFFERENTIAL  ETHANOL  I-STAT CHEM 8, ED                                                                                                                          Radiology CT ANGIO HEAD NECK W WO CM (CODE STROKE)  Result Date: 02/24/2022 CLINICAL DATA:  Stroke, follow up EXAM: CT ANGIOGRAPHY HEAD AND NECK TECHNIQUE: Multidetector CT imaging of the head and neck was performed using the standard protocol during bolus administration of intravenous contrast. Multiplanar CT image reconstructions and MIPs were obtained to evaluate the vascular anatomy. Carotid stenosis measurements (when applicable) are obtained utilizing NASCET criteria, using the distal internal carotid diameter as the denominator. RADIATION DOSE REDUCTION: This exam was performed according to the departmental dose-optimization program which includes automated exposure control,  adjustment of the mA and/or kV according to patient size and/or use of iterative reconstruction technique. CONTRAST:  165m OMNIPAQUE IOHEXOL 350 MG/ML SOLN COMPARISON:  Same day CT head. CTA from March 27, 2018 is not retrievable at the time of dictation. FINDINGS: CTA NECK FINDINGS Aortic arch: Great vessel origins are patent without significant stenosis. Right carotid system: No evidence of dissection, stenosis (50% or greater), or occlusion. Retropharyngeal course. Left carotid system: No evidence of dissection, stenosis (50% or greater), or occlusion. Vertebral arteries: Left dominant. No evidence of dissection, stenosis (50% or greater), or occlusion. Skeleton: Moderate multilevel degenerative change. No acute fracture. Other neck: No acute findings. Upper chest: Biapical pleuroparenchymal scarring. No consolidation in the visualized lung apices. Review of the MIP images confirms the above findings CTA HEAD FINDINGS Anterior circulation: Bilateral intracranial ICAs, MCAs, and ACAs are patent without proximal hemodynamically significant stenosis. Posterior circulation: Bilateral intradural vertebral arteries, basilar artery and bilateral posterior cerebral arteries are patent without proximal hemodynamically significant stenosis. Mild narrowing of the mid right vertebral artery due to calcified atherosclerosis. Venous sinuses: As permitted by contrast timing, patent. Review of the MIP images confirms the above findings IMPRESSION:  No emergent large vessel occlusion or proximal hemodynamically significant stenosis. Electronically Signed   By: Margaretha Sheffield M.D.   On: 02/24/2022 16:16   CT HEAD CODE STROKE WO CONTRAST`  Result Date: 02/24/2022 CLINICAL DATA:  Code stroke. Intermittent headaches for 2 weeks. Visual disturbance left eye for 2 days. Numbness and tingling left hand earlier today that has since resolved. EXAM: CT HEAD WITHOUT CONTRAST TECHNIQUE: Contiguous axial images were obtained from  the base of the skull through the vertex without intravenous contrast. RADIATION DOSE REDUCTION: This exam was performed according to the departmental dose-optimization program which includes automated exposure control, adjustment of the mA and/or kV according to patient size and/or use of iterative reconstruction technique. COMPARISON:  Report of CTA head and neck 03/27/2018 at [place]: Brain: Periventricular and subcortical white matter hypoattenuation is greatest in the anterior frontal lobes bilaterally. Areas of hypoattenuation are again noted into the anterior limb of the internal capsule bilaterally in the right greater than left external capsule. Basal ganglia are within normal limits. Thalami are normal. No acute infarct, hemorrhage, or mass lesion is present. The ventricles are of normal size. No significant extraaxial fluid collection is present. The craniocervical junction is normal. Upper cervical spine is within normal limits. Marrow signal is unremarkable. Dense calcifications are present in the pineal gland. No soft tissue mass is present. Vascular: Atherosclerotic calcifications are present within the cavernous internal carotid arteries bilaterally and at the dural margins of both vertebral arteries. No hyperdense vessel is present. Skull: Calvarium is intact. No focal lytic or blastic lesions are present. No significant extracranial soft tissue lesion is present. Sinuses/Orbits: Fluid is present in the right maxillary sinus. Mild mucosal thickening is present left maxillary sinus and anterior ethmoid air cells bilaterally. Secretions are noted in the right sphenoid sinus. The paranasal sinuses and mastoid air cells are otherwise clear. The globes and orbits are within normal limits. ASPECTS [facility] Stroke Program Early CT Score) - Ganglionic level infarction (caudate, lentiform nuclei, internal capsule, insula, M1-M3 cortex): 7/7 - Supraganglionic infarction (M4-M6 cortex): 3/3  Total score (0-10 with 10 being normal): 10/10 IMPRESSION: 1. No acute intracranial abnormality or significant interval change. 2. Stable atrophy and white matter disease. This likely reflects the sequela of chronic microvascular ischemia. 3. Aspects is 10/10. 4. Acute on chronic right maxillary sinus disease. These results were called by telephone at the time of interpretation on 02/24/2022 at 3:43 pm to provider [facility] , who verbally acknowledged these results. Electronically Signed   By: San Morelle M.D.   On: 02/24/2022 15:44    Pertinent labs & imaging results that were available during my care of the patient were reviewed by me and considered in my medical decision making (see MDM for details).  Medications Ordered in ED Medications  sodium chloride flush (NS) 0.9 % injection 3 mL (has no administration in time range)  LORazepam (ATIVAN) injection 0.5 mg (has no administration in time range)  iohexol (OMNIPAQUE) 350 MG/ML injection 100 mL (100 mLs Intravenous Contrast Given 02/24/22 1537)  Procedures .Critical Care  Performed by: Cristie Hem, MD Authorized by: Cristie Hem, MD   Critical care provider statement:    Critical care time (minutes):  30   Critical care end time:  02/24/2022 4:23 PM   Critical care time was exclusive of:  Separately billable procedures and treating other patients   Critical care was necessary to treat or prevent imminent or life-threatening deterioration of the following conditions:  CNS failure or compromise   Critical care was time spent personally by me on the following activities:  Ordering and review of laboratory studies, ordering and review of radiographic studies, development of treatment plan with patient or surrogate, discussions with consultants, pulse oximetry, re-evaluation of patient's  condition, review of old charts, examination of patient and obtaining history from patient or surrogate   Care discussed with: admitting provider     (including critical care time)  Medical Decision Making / ED Course   MDM:  76 year old female presenting to the emergency department with numbness in the left hand.  Differential includes stroke, TIA, intracranial hemorrhage, complex migraine, peripheral neuropathy.  Given his acute onset of symptoms earlier today, code stroke was activated in triage.  Patiently currently on teleneurology with Dr. Malen Gauze discussing symptoms.  Her symptoms are now resolved and reported symptoms were minimal so doubt patient would be tPA candidate.  Patient will get neuroimaging.  Will check visual acuity.  We will follow-up neurology recommendations.  Clinical Course as of 02/24/22 1623  Fri Feb 24, 2022  1555 Discussed with neurology Dr. Malen Gauze, recommends admit to inpatient for stroke/TIA workup  [WS]  1619 Signed out to hospitalist for stroke /TIA workup. tPA was considered but given symptoms resolved, not given.  [WS]    Clinical Course User Index [WS] Cristie Hem, MD     Additional history obtained: -Additional history obtained from spouse -External records from outside source obtained and reviewed including: Chart review including previous notes, labs, imaging, consultation notes including last ED visit 09/11/21   Lab Tests: -I ordered, reviewed, and interpreted labs.   The pertinent results include:   Labs Reviewed  CBC - Abnormal; Notable for the following components:      Result Value   RBC 5.17 (*)    Hemoglobin 15.2 (*)    All other components within normal limits  COMPREHENSIVE METABOLIC PANEL - Abnormal; Notable for the following components:   Glucose, Bld 112 (*)    ALT 50 (*)    All other components within normal limits  CBG MONITORING, ED - Abnormal; Notable for the following components:   Glucose-Capillary 117 (*)     All other components within normal limits  PROTIME-INR  APTT  DIFFERENTIAL  ETHANOL  I-STAT CHEM 8, ED    Notable for elevated hemoglobin, possible dehydration  EKG   EKG Interpretation  Date/Time:  Friday February 24 2022 15:07:22 EDT Ventricular Rate:  72 PR Interval:  164 QRS Duration: 82 QT Interval:  396 QTC Calculation: 433 R Axis:   67 Text Interpretation: Normal sinus rhythm Normal ECG Confirmed by Garnette Gunner [phone]) on 02/24/2022 3:26:19 PM         Imaging Studies ordered: I ordered imaging studies including CT head On my interpretation imaging demonstrates no acute bleed I independently visualized and interpreted imaging. I agree with the radiologist interpretation   Medicines ordered and prescription drug management: Meds ordered this encounter  Medications   sodium chloride flush (NS) 0.9 % injection 3 mL   iohexol (  OMNIPAQUE) 350 MG/ML injection 100 mL   LORazepam (ATIVAN) injection 0.5 mg    -I have reviewed the patients home medicines and have made adjustments as needed   Consultations Obtained: I requested consultation with the neurologist,  and discussed lab and imaging findings as well as pertinent plan - they recommend: admit for stroke/TIA workup   Cardiac Monitoring: The patient was maintained on a cardiac monitor.  I personally viewed and interpreted the cardiac monitored which showed an underlying rhythm of: NSR   Reevaluation: After the interventions noted above, I reevaluated the patient and found that they have resolved  Co morbidities that complicate the patient evaluation  Past Medical History:  Diagnosis Date   Diverticulitis    Hypothyroidism       Dispostion: Disposition decision including need for hospitalization was considered, and patient admitted to the hospital.    Final Clinical Impression(s) / ED Diagnoses Final diagnoses:  Numbness and tingling in left hand     This chart was dictated using voice  recognition software.  Despite best efforts to proofread,  errors can occur which can change the documentation meaning.    Cristie Hem, MD 02/24/22 1623

## 2022-02-24 NOTE — Progress Notes (Signed)
1515 call time 1516 beeper time 1533 exam started 1534 exam finished 1535 images sent to soc 1537 exam completed in epic 1537 [facility] radiology called

## 2022-02-24 NOTE — H&P (Signed)
History and Physical    Patient: Desiree Flores ZHY:[id number] DOB: Jun 02, 1945 DOA: 02/24/2022 DOS: the patient was seen and examined on 02/24/2022 PCP: Caryl Bis, MD  Patient coming from: Home  Chief Complaint: No chief complaint on file.  HPI: Desiree Flores is a 76 y.o. female with medical history significant of hypothyroidism, GERD.  She presents with sudden onset of numbness that involves her left index finger and thumb, and little finger that started at 215 today.  Symptoms occurred all of a sudden and was accompanied by a difficulty moving her fingers.  Her symptoms lasted for a few minutes and then resolved.  She does report a intermittent mild headache for the past 2 weeks.  She has been taking Tylenol or Advil as needed.  She denies chest pain, shortness of breath, arm pain, neck pain.  Review of Systems: As mentioned in the history of present illness. All other systems reviewed and are negative. Past Medical History:  Diagnosis Date   Diverticulitis    Hypothyroidism    Past Surgical History:  Procedure Laterality Date   APPENDECTOMY     6-7 yrs ago   COLONOSCOPY     COLONOSCOPY N/A 01/28/2014   Procedure: COLONOSCOPY;  Surgeon: Rogene Houston, MD;  [facility];  Service: Endoscopy;  Laterality: N/A;  200   DILATION AND CURETTAGE OF UTERUS     miscarriage   ESOPHAGEAL DILATION  08/03/2015   Procedure: ESOPHAGEAL DILATION;  Surgeon: Rogene Houston, MD;  [facility]: [facility];  Service: Endoscopy;;   ESOPHAGEAL DILATION N/A 09/19/2017   Procedure: ESOPHAGEAL DILATION;  Surgeon: Rogene Houston, MD;  [facility];  Service: Endoscopy;  Laterality: N/A;   ESOPHAGEAL DILATION N/A 07/15/2020   Procedure: ESOPHAGEAL DILATION;  Surgeon: Rogene Houston, MD;  [facility];  Service: Endoscopy;  Laterality: N/A;   ESOPHAGOGASTRODUODENOSCOPY N/A 08/03/2015   Procedure: ESOPHAGOGASTRODUODENOSCOPY (EGD);  Surgeon: Rogene Houston, MD;   [facility];  Service: Endoscopy;  Laterality: N/A;   ESOPHAGOGASTRODUODENOSCOPY N/A 09/19/2017   Procedure: ESOPHAGOGASTRODUODENOSCOPY (EGD);  Surgeon: Rogene Houston, MD;  [facility];  Service: Endoscopy;  Laterality: N/A;  12:40-rescheduled to 5/15 @ 7:30am per Lelon Frohlich   ESOPHAGOGASTRODUODENOSCOPY N/A 07/15/2020   Procedure: ESOPHAGOGASTRODUODENOSCOPY (EGD);  Surgeon: Rogene Houston, MD;  [facility];  Service: Endoscopy;  Laterality: N/A;  AM   Social History:  reports that she has never smoked. She has been exposed to tobacco smoke. She has never used smokeless tobacco. She reports that she does not drink alcohol and does not use drugs.  Allergies  Allergen Reactions   Sulfa Antibiotics Other (See Comments)    Yeast infection    Family History  Problem Relation Age of Onset   Colon cancer Other     Prior to Admission medications   Medication Sig Start Date End Date Taking? Authorizing Provider  Acetaminophen (TYLENOL EXTRA STRENGTH PO) Take by mouth. '500mg'$  one daily as needed.    [provider]  Calcium Carbonate-Vitamin D (CALCIUM-VITAMIN D3 PO) Take 1 tablet by mouth. One bid    [provider]  ciprofloxacin (CIPRO) 500 MG tablet Take 1 tablet (500 mg total) by mouth 2 (two) times daily. Patient not taking: Reported on 07/19/2021 01/18/21   Rogene Houston, MD  Glucosamine HCl (GLUCOSAMINE PO) Take 2 tablets by mouth daily in the afternoon.    [provider]  levothyroxine (SYNTHROID, LEVOTHROID) 100  MCG tablet Take 100 mcg by mouth daily before breakfast.    [provider]  metroNIDAZOLE (FLAGYL) 500 MG tablet Take 1 tablet (500 mg total) by mouth 2 (two) times daily. Patient not taking: Reported on 07/19/2021 01/18/21   Rogene Houston, MD  Multiple Vitamin (MULTIVITAMIN WITH MINERALS) TABS tablet Take 1 tablet by mouth daily in the afternoon.    [provider]  pantoprazole (PROTONIX) 40 MG  tablet TAKE 1 TABLET (40 MG TOTAL) BY MOUTH TWICE A DAY BEFORE MEALS 11/03/21   Rogene Houston, MD    Physical Exam: Vitals:   02/24/22 1458 02/24/22 1459 02/24/22 1536 02/24/22 1552  BP: (!) 159/95  (!) 142/68 130/81  Pulse: 73  79 84  Resp: '18  18 14  '$ Temp: 98.2 F (36.8 C)     SpO2: 96%  99% 98%  Weight:  68 kg    Height:  '5\' 6"'$  (1.676 m)     General: Elderly female. Awake and alert and oriented x3. No acute cardiopulmonary distress.  HEENT: Normocephalic atraumatic.  Right and left ears normal in appearance.  Pupils equal, round, reactive to light. Extraocular muscles are intact. Sclerae anicteric and noninjected.  Moist mucosal membranes. No mucosal lesions.  Neck: Neck supple without lymphadenopathy. No carotid bruits. No masses palpated.  Cardiovascular: Regular rate with normal S1-S2 sounds. No murmurs, rubs, gallops auscultated. No JVD.  Respiratory: Good respiratory effort with no wheezes, rales, rhonchi. Lungs clear to auscultation bilaterally.  No accessory muscle use. Abdomen: Soft, nontender, nondistended. Active bowel sounds. No masses or hepatosplenomegaly  Skin: No rashes, lesions, or ulcerations.  Dry, warm to touch. 2+ dorsalis pedis and radial pulses. Musculoskeletal: No calf or leg pain. All major joints not erythematous nontender.  No upper or lower joint deformation.  Good ROM.  No contractures  Psychiatric: Intact judgment and insight. Pleasant and cooperative. Neurologic: No focal neurological deficits. Strength is 5/5 and symmetric in upper and lower extremities.  Cranial nerves II through XII are grossly intact.  Data Reviewed: Results for orders placed or performed during the hospital encounter of 02/24/22 (from the past 24 hour(s))  CBG monitoring, ED     Status: Abnormal   Collection Time: 02/24/22  3:08 PM  Result Value Ref Range   Glucose-Capillary 117 (H) 70 - 99 mg/dL  Protime-INR     Status: None   Collection Time: 02/24/22  3:23 PM  Result Value  Ref Range   Prothrombin Time 13.3 11.4 - 15.2 seconds   INR 1.0 0.8 - 1.2  APTT     Status: None   Collection Time: 02/24/22  3:23 PM  Result Value Ref Range   aPTT 31 24 - 36 seconds  CBC     Status: Abnormal   Collection Time: 02/24/22  3:23 PM  Result Value Ref Range   WBC 5.4 4.0 - 10.5 K/uL   RBC 5.17 (H) 3.87 - 5.11 MIL/uL   Hemoglobin 15.2 (H) 12.0 - 15.0 g/dL   HCT 44.8 36.0 - 46.0 %   MCV 86.7 80.0 - 100.0 fL   MCH 29.4 26.0 - 34.0 pg   MCHC 33.9 30.0 - 36.0 g/dL   RDW 13.3 11.5 - 15.5 %   Platelets 189 150 - 400 K/uL   nRBC 0.0 0.0 - 0.2 %  Differential     Status: None   Collection Time: 02/24/22  3:23 PM  Result Value Ref Range   Neutrophils Relative % 55 %   Neutro  Abs 3.0 1.7 - 7.7 K/uL   Lymphocytes Relative 32 %   Lymphs Abs 1.7 0.7 - 4.0 K/uL   Monocytes Relative 8 %   Monocytes Absolute 0.5 0.1 - 1.0 K/uL   Eosinophils Relative 4 %   Eosinophils Absolute 0.2 0.0 - 0.5 K/uL   Basophils Relative 1 %   Basophils Absolute 0.0 0.0 - 0.1 K/uL   Immature Granulocytes 0 %   Abs Immature Granulocytes 0.01 0.00 - 0.07 K/uL  Comprehensive metabolic panel     Status: Abnormal   Collection Time: 02/24/22  3:23 PM  Result Value Ref Range   Sodium 140 135 - 145 mmol/L   Potassium 3.9 3.5 - 5.1 mmol/L   Chloride 109 98 - 111 mmol/L   CO2 23 22 - 32 mmol/L   Glucose, Bld 112 (H) 70 - 99 mg/dL   BUN 23 8 - 23 mg/dL   Creatinine, Ser 0.91 0.44 - 1.00 mg/dL   Calcium 9.3 8.9 - 10.3 mg/dL   Total Protein 7.6 6.5 - 8.1 g/dL   Albumin 4.3 3.5 - 5.0 g/dL   AST 34 15 - 41 U/L   ALT 50 (H) 0 - 44 U/L   Alkaline Phosphatase 70 38 - 126 U/L   Total Bilirubin 0.7 0.3 - 1.2 mg/dL   GFR, Estimated >60 >60 mL/min   Anion gap 8 5 - 15  Ethanol     Status: None   Collection Time: 02/24/22  3:23 PM  Result Value Ref Range   Alcohol, Ethyl (B) <10 <10 mg/dL   CT ANGIO HEAD NECK W WO CM (CODE STROKE)  Result Date: 02/24/2022 CLINICAL DATA:  Stroke, follow up EXAM: CT  ANGIOGRAPHY HEAD AND NECK TECHNIQUE: Multidetector CT imaging of the head and neck was performed using the standard protocol during bolus administration of intravenous contrast. Multiplanar CT image reconstructions and MIPs were obtained to evaluate the vascular anatomy. Carotid stenosis measurements (when applicable) are obtained utilizing NASCET criteria, using the distal internal carotid diameter as the denominator. RADIATION DOSE REDUCTION: This exam was performed according to the departmental dose-optimization program which includes automated exposure control, adjustment of the mA and/or kV according to patient size and/or use of iterative reconstruction technique. CONTRAST:  162m OMNIPAQUE IOHEXOL 350 MG/ML SOLN COMPARISON:  Same day CT head. CTA from March 27, 2018 is not retrievable at the time of dictation. FINDINGS: CTA NECK FINDINGS Aortic arch: Great vessel origins are patent without significant stenosis. Right carotid system: No evidence of dissection, stenosis (50% or greater), or occlusion. Retropharyngeal course. Left carotid system: No evidence of dissection, stenosis (50% or greater), or occlusion. Vertebral arteries: Left dominant. No evidence of dissection, stenosis (50% or greater), or occlusion. Skeleton: Moderate multilevel degenerative change. No acute fracture. Other neck: No acute findings. Upper chest: Biapical pleuroparenchymal scarring. No consolidation in the visualized lung apices. Review of the MIP images confirms the above findings CTA HEAD FINDINGS Anterior circulation: Bilateral intracranial ICAs, MCAs, and ACAs are patent without proximal hemodynamically significant stenosis. Posterior circulation: Bilateral intradural vertebral arteries, basilar artery and bilateral posterior cerebral arteries are patent without proximal hemodynamically significant stenosis. Mild narrowing of the mid right vertebral artery due to calcified atherosclerosis. Venous sinuses: As permitted by  contrast timing, patent. Review of the MIP images confirms the above findings IMPRESSION: No emergent large vessel occlusion or proximal hemodynamically significant stenosis. Electronically Signed   By: FMargaretha SheffieldM.D.   On: 02/24/2022 16:16   CT HEAD CODE STROKE WO  CONTRAST`  Result Date: 02/24/2022 CLINICAL DATA:  Code stroke. Intermittent headaches for 2 weeks. Visual disturbance left eye for 2 days. Numbness and tingling left hand earlier today that has since resolved. EXAM: CT HEAD WITHOUT CONTRAST TECHNIQUE: Contiguous axial images were obtained from the base of the skull through the vertex without intravenous contrast. RADIATION DOSE REDUCTION: This exam was performed according to the departmental dose-optimization program which includes automated exposure control, adjustment of the mA and/or kV according to patient size and/or use of iterative reconstruction technique. COMPARISON:  Report of CTA head and neck 03/27/2018 at [facility]: Brain: Periventricular and subcortical white matter hypoattenuation is greatest in the anterior frontal lobes bilaterally. Areas of hypoattenuation are again noted into the anterior limb of the internal capsule bilaterally in the right greater than left external capsule. Basal ganglia are within normal limits. Thalami are normal. No acute infarct, hemorrhage, or mass lesion is present. The ventricles are of normal size. No significant extraaxial fluid collection is present. The craniocervical junction is normal. Upper cervical spine is within normal limits. Marrow signal is unremarkable. Dense calcifications are present in the pineal gland. No soft tissue mass is present. Vascular: Atherosclerotic calcifications are present within the cavernous internal carotid arteries bilaterally and at the dural margins of both vertebral arteries. No hyperdense vessel is present. Skull: Calvarium is intact. No focal lytic or blastic lesions are present. No  significant extracranial soft tissue lesion is present. Sinuses/Orbits: Fluid is present in the right maxillary sinus. Mild mucosal thickening is present left maxillary sinus and anterior ethmoid air cells bilaterally. Secretions are noted in the right sphenoid sinus. The paranasal sinuses and mastoid air cells are otherwise clear. The globes and orbits are within normal limits. ASPECTS [facility] Stroke Program Early CT Score) - Ganglionic level infarction (caudate, lentiform nuclei, internal capsule, insula, M1-M3 cortex): 7/7 - Supraganglionic infarction (M4-M6 cortex): 3/3 Total score (0-10 with 10 being normal): 10/10 IMPRESSION: 1. No acute intracranial abnormality or significant interval change. 2. Stable atrophy and white matter disease. This likely reflects the sequela of chronic microvascular ischemia. 3. Aspects is 10/10. 4. Acute on chronic right maxillary sinus disease. These results were called by telephone at the time of interpretation on 02/24/2022 at 3:43 pm to provider [facility] , who verbally acknowledged these results. Electronically Signed   By: San Morelle M.D.   On: 02/24/2022 15:44     Assessment and Plan: No notes have been filed under this hospital service. Service: Hospitalist  Principal Problem:   Finger numbness Active Problems:   GERD (gastroesophageal reflux disease)   Hypothyroidism  Finger numbness I discussed patient with both the EDP as well as Dr. Malen Gauze of neurology.  We will need to get MRI to rule out hand knob.  Check echocardiogram in the morning Check lipids and hemoglobin A1c tomorrow PT/OT/speech therapy for evaluations. Start aspirin Dr. Malen Gauze is a available over the weekend and plans to follow-up on imaging and recommendations. GERD Continue home regimen Hypothyroidism Continue home regimen   Advance Care Planning:   Code Status: Prior full code  Consults: neuro  Family Communication: husband  Severity of Illness: The  appropriate patient status for this patient is OBSERVATION. Observation status is judged to be reasonable and necessary in order to provide the required intensity of service to ensure the patient's safety. The patient's presenting symptoms, physical exam findings, and initial radiographic and laboratory data in the context of their medical condition is felt to place them at decreased  risk for further clinical deterioration. Furthermore, it is anticipated that the patient will be medically stable for discharge from the hospital within 2 midnights of admission.   Author: Truett Mainland, DO 02/24/2022 4:25 PM  For on call review [email or web address].

## 2022-02-24 NOTE — ED Triage Notes (Signed)
Pt reports intermittent headaches x 2 weeks with visual disturbance to left eye x 2 days. Pt also reports some numbness/tingling in left hand earlier today that is resolved currently.

## 2022-02-24 NOTE — ED Notes (Signed)
Patient transported to MRI 

## 2022-02-24 NOTE — ED Notes (Signed)
Attending MD notified of pt passing swallow screen

## 2022-02-24 NOTE — Progress Notes (Signed)
1511: Code stroke cart activation. EDP finishing up assessment.  1515: Dr.Arora paged. 1515: Dr/ Rory Percy on camera for neuro assessment. Report given. Dr. Rory Percy also provided orders and recommendations for EDP and nursing staff at [facility]. Patient on the way to CT at this time.    Berenice Bouton MSN,RN -Telestroke RN

## 2022-02-24 NOTE — Consult Note (Signed)
[facility] Telemedicine Consult  Requesting Provider: Dr. Antionette Fairy Consult Participants: Dr. Jerelyn Charles, Telespecialist RN Tracy/Glenda   bedside RN Legrand Como Location of the provider: [place] Location of the patient: Desiree Flores emergency room by 2  This consult was provided via telemedicine with 2-way video and audio communication. The patient/family was informed that care would be provided in this way and agreed to receive care in this manner.   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 She reports that over the past 2 weeks she has been having mild headaches-never has headaches in the past but this is something new for her for which she has been taking Tylenol or Advil as needed.  Today also she had a mild headache but nothing very severe.  About 2 nights ago, she started seeing some jagged lines in her vision that she describes these not as squiggly lines but just a straight thin pencil line drawn on a piece of paper in the middle of her field of vision that would move on both eyes.  Today, she was in her usual state of health when all of a sudden she noticed that her left hand felt numb.  She could not move all the fingers and described the numbness involving the left little finger and left thumb as well as left index finger. Symptoms lasted ffew min and resolved. Because the symptoms were concerning for stroke, she had her husband drive her to the emergency room.  Because of the last known well being within 4 and half hours, this was activated as a code stroke.   Past Medical History:  Diagnosis Date   Diverticulitis    Hypothyroidism      Current Facility-Administered Medications:    sodium chloride  flush (NS) 0.9 % injection 3 mL, 3 mL, Intravenous, Once, Hayden Rasmussen, MD  Current Outpatient Medications:    Acetaminophen (TYLENOL EXTRA STRENGTH PO), Take by mouth. '500mg'$  one daily as needed., Disp: , Rfl:    Calcium Carbonate-Vitamin D (CALCIUM-VITAMIN D3 PO), Take 1 tablet by mouth. One bid, Disp: , Rfl:    ciprofloxacin (CIPRO) 500 MG tablet, Take 1 tablet (500 mg total) by mouth 2 (two) times daily. (Patient not taking: Reported on 07/19/2021), Disp: 20 tablet, Rfl: 0   Glucosamine HCl (GLUCOSAMINE PO), Take 2 tablets by mouth daily in the afternoon., Disp: , Rfl:    levothyroxine (SYNTHROID, LEVOTHROID) 100 MCG tablet, Take 100 mcg by mouth daily before breakfast., Disp: , Rfl:    metroNIDAZOLE (FLAGYL) 500 MG tablet, Take 1 tablet (500 mg total) by mouth 2 (two) times daily. (Patient not taking: Reported on 07/19/2021), Disp: 20 tablet, Rfl: 0   Multiple Vitamin (MULTIVITAMIN WITH MINERALS) TABS tablet, Take 1 tablet by mouth daily in the afternoon., Disp: , Rfl:    pantoprazole (PROTONIX) 40 MG tablet, TAKE 1 TABLET (40 MG TOTAL) BY MOUTH TWICE A DAY BEFORE MEALS, Disp: 60 tablet, Rfl: 2  LKW: 1415 hrs. tpa given?: No, resolved symptoms IR Thrombectomy? No, NIH 0, exam not consistent with the LVO Modified Rankin Scale: 0-Completely asymptomatic and back to baseline post- stroke Time of teleneurologist evaluation: 1515 hrs.  Exam: Vitals:   02/24/22 1458  BP: (!) 159/95  Pulse: 73  Resp: 18  Temp: 98.2 F (36.8 C)  SpO2: 96%   General: Alert in no distress Neurological exam Awake alert oriented x3.  No evidence of dysarthria.  No evidence of aphasia.  Cranial nerves II to XII intact.  Motor examination with no drift.  Sensory exam with no difference in sensation between the left and right face arm and leg.  No dysmetria.  Gait testing deferred.  NIHSS-0  Imaging Reviewed: CT head: No acute changes, aspects 10.   Labs reviewed in epic and pertinent values  follow: CBC    Component Value Date/Time   WBC 11.0 (H) 10/19/2020 1157   RBC 5.19 (H) 10/19/2020 1157   HGB 15.2 (H) 10/19/2020 1157   HCT 45.9 10/19/2020 1157   PLT 203 10/19/2020 1157   MCV 88.4 10/19/2020 1157   MCH 29.3 10/19/2020 1157   MCHC 33.1 10/19/2020 1157   RDW 13.0 10/19/2020 1157   LYMPHSABS 0.8 10/19/2020 1157   MONOABS 0.7 10/19/2020 1157   EOSABS 0.1 10/19/2020 1157   BASOSABS 0.0 10/19/2020 1157   CMP     Component Value Date/Time   Flores 137 10/19/2020 1157   K 3.7 10/19/2020 1157   CL 103 10/19/2020 1157   CO2 24 10/19/2020 1157   GLUCOSE 121 (H) 10/19/2020 1157   BUN 13 10/19/2020 1157   CREATININE 0.83 10/19/2020 1157   CALCIUM 9.6 10/19/2020 1157   PROT 7.9 10/19/2020 1157   ALBUMIN 4.4 10/19/2020 1157   AST 24 10/19/2020 1157   ALT 31 10/19/2020 1157   ALKPHOS 73 10/19/2020 1157   BILITOT 0.8 10/19/2020 1157   GFRNONAA >60 10/19/2020 1157   GFRAA >60 08/04/2015 7824   Assessment: 76 year old with no significant cerebrovascular risk factors, presenting for evaluation of sudden onset of left hand numbness involving her fifth second and first digits which lasted for a few minutes and then self resolved.  She was concern for strokelike symptoms and presented for evaluation. She has been having some headache ongoing for the past 2 weeks and some visual symptoms of seeing lines. Differentials include complex migraine, versus TIA very specific stroke that involves specific area in the frontal lobe called the hand knob can also present with the symptoms that she is describing. Further imaging with MRI would be helpful and stroke/TIA risk factor work-up is also not improved and given ABCD2 score of 4.(One-point for age, one-point for blood pressure, 2 points for duration)  Recommendations:  MRI to rule out hand knob stroke Admit to the hospitalist-should be fine to admit at [facility] given lack of beds [facility] the [facility]. Frequent  neurochecks Telemetry Check A1c-goal less than 7 Check blood panel-goal LDL less than 70.  If greater than 70, start high intensity statin. 2D echo Aspirin 81 PT OT Speech therapy Allow permissive hypertension-treat only if systolic blood pressures greater than 220 on a as needed basis for the next 24 to 48 hours.  Eventual blood pressure goal will be normotension.   Discussed with EDP Dr. Truett Mainland  I am back on telestroke service tomorrow, although there is no routine follow-up/consultative coverage for [facility] over the weekend, please feel free to contact me once all the work-up is completed so that I can check her chart and give you a final set of recommendations. -- Amie Portland, MD Neurologist [facility] Pager: [phone]

## 2022-02-25 ENCOUNTER — Observation Stay (HOSPITAL_BASED_OUTPATIENT_CLINIC_OR_DEPARTMENT_OTHER): Payer: Medicare HMO

## 2022-02-25 DIAGNOSIS — G459 Transient cerebral ischemic attack, unspecified: Secondary | ICD-10-CM | POA: Diagnosis present

## 2022-02-25 DIAGNOSIS — R200 Anesthesia of skin: Secondary | ICD-10-CM

## 2022-02-25 LAB — LIPID PANEL
Cholesterol: 161 mg/dL (ref 0–200)
HDL: 38 mg/dL — ABNORMAL LOW (ref >40)
LDL Cholesterol: 93 mg/dL (ref 0–99)
Total CHOL/HDL Ratio: 4.2 RATIO
Triglycerides: 149 mg/dL (ref <150)
VLDL: 30 mg/dL (ref 0–40)

## 2022-02-25 LAB — ECHOCARDIOGRAM COMPLETE
AR max vel: 2.22 cm2
AV Area VTI: 2.39 cm2
AV Area mean vel: 2.43 cm2
AV Mean grad: 4 mmHg
AV Peak grad: 8.6 mmHg
Ao pk vel: 1.47 m/s
Area-P 1/2: 3.11 cm2
Height: 66 in
MV VTI: 2.95 cm2
S' Lateral: 2.2 cm
Weight: 2616 oz

## 2022-02-25 LAB — HEMOGLOBIN A1C
Hgb A1c MFr Bld: 5.2 % (ref 4.8–5.6)
Mean Plasma Glucose: 102.54 mg/dL

## 2022-02-25 MED ORDER — ATORVASTATIN CALCIUM 40 MG PO TABS: 40.0000 mg | ORAL_TABLET | Freq: Every day | ORAL | 3 refills | Status: AC

## 2022-02-25 MED ORDER — ASPIRIN 81 MG PO TBEC: 81.0000 mg | DELAYED_RELEASE_TABLET | Freq: Every day | ORAL | 2 refills | Status: AC

## 2022-02-25 NOTE — Progress Notes (Signed)
Nsg Discharge Note  Admit Date:  02/24/2022 Discharge date: 02/25/2022   Desiree Flores to be D/C'd Home per MD order.  AVS completed.  Copy for chart, and copy for patient signed, and dated. Patient/caregiver able to verbalize understanding.  Discharge Medication: Allergies as of 02/25/2022       Reactions   Sulfa Antibiotics Other (See Comments)   Yeast infection        Medication List     STOP taking these medications    ciprofloxacin 500 MG tablet Commonly known as: CIPRO   metroNIDAZOLE 500 MG tablet Commonly known as: FLAGYL       TAKE these medications    aspirin EC 81 MG tablet Take 1 tablet (81 mg total) by mouth daily with breakfast.   atorvastatin 40 MG tablet Commonly known as: Lipitor Take 1 tablet (40 mg total) by mouth daily.   CALCIUM-VITAMIN D3 PO Take 1 tablet by mouth 2 (two) times daily.   GLUCOSAMINE PO Take 2 tablets by mouth daily in the afternoon.   levothyroxine 100 MCG tablet Commonly known as: SYNTHROID Take 100 mcg by mouth daily before breakfast.   multivitamin with minerals Tabs tablet Take 1 tablet by mouth daily in the afternoon.   pantoprazole 40 MG tablet Commonly known as: PROTONIX TAKE 1 TABLET (40 MG TOTAL) BY MOUTH TWICE A DAY BEFORE MEALS What changed: See the new instructions.   TYLENOL EXTRA STRENGTH PO Take 500 mg by mouth daily as needed (pain or headache).        Discharge Assessment: Vitals:   02/25/22 0618 02/25/22 1500  BP: 133/71 110/64  Pulse: 70 64  Resp: 20 20  Temp: 97.9 F (36.6 C) 97.9 F (36.6 C)  SpO2: 98% 97%   Skin clean, dry and intact without evidence of skin break down, no evidence of skin tears noted. IV catheter discontinued intact. Site without signs and symptoms of complications - no redness or edema noted at insertion site, patient denies c/o pain - only slight tenderness at site.  Dressing with slight pressure applied.  D/c Instructions-Education: Discharge  instructions given to patient/family with verbalized understanding. D/c education completed with patient/family including follow up instructions, medication list, d/c activities limitations if indicated, with other d/c instructions as indicated by MD - patient able to verbalize understanding, all questions fully answered. Patient instructed to return to ED, call 911, or call MD for any changes in condition.  Patient escorted via [place], and D/C home via private auto.  Clovis Fredrickson, LPN 70/26/3785 8:85 PM

## 2022-02-25 NOTE — Discharge Instructions (Signed)
1)Take baby aspirin and atorvastatin/Lipitor for stroke prevention 2)Avoid ibuprofen/Advil/Aleve/Motrin/Goody Powders/Naproxen/BC powders/Meloxicam/Diclofenac/Indomethacin and other Nonsteroidal anti-inflammatory medications as these will make you more likely to bleed and can cause stomach ulcers, can also cause Kidney problems.  3) follow-up primary care physician in a couple weeks for recheck and reevaluation

## 2022-02-25 NOTE — Progress Notes (Signed)
On call neurologist chart review note  Dr. Denton Brick reports she is completely back to baseline. LDL is 93-goal is less than 70.  Atorvastatin 40 now and daily A1c-pending.  Goal less than 7 Aspirin 81 for stroke prevention Echo: Pending.  Unless there is significant abnormalities, can follow-up as outpatient with neurology in 8 to 12 weeks.  Discharge diagnosis: TIA versus complex migraine  Plan d/w Dr. Denton Brick  -- Amie Portland, MD Neurologist [facility] Pager: [phone]

## 2022-02-25 NOTE — Evaluation (Signed)
Physical Therapy Evaluation Patient Details Name: Desiree Flores MRN: [id number] DOB: 03/21/1946 Today's Date: 02/25/2022  History of Present Illness  Desiree Flores is a 76 y.o. female with medical history significant of hypothyroidism, GERD.  She presents with sudden onset of numbness that involves her left index finger and thumb, and little finger that started at 215 today.  Symptoms occurred all of a sudden and was accompanied by a difficulty moving her fingers.  Her symptoms lasted for a few minutes and then resolved.  She does report a intermittent mild headache for the past 2 weeks.  She has been taking Tylenol or Advil as needed.  She denies chest pain, shortness of breath, arm pain, neck pain.   Clinical Impression  Patient awake and alert lying in bed on therapist arrival.  States that Left hand finger numbness has resolved; some pain in the right shoulder that is better with Tylenol.  Husband present at bedside.  Patient supine to sit and then sit to stand with Supervision; patient able to walk in room and hallway without AD x 150 ft; no loss of balance or gait deviation noted.  Patient returns to the room and sits up in a chair with her husband to eat her breakfast.  No further physical therapy services needed at this time.      Recommendations for follow up therapy are one component of a multi-disciplinary discharge planning process, led by the attending physician.  Recommendations may be updated based on patient status, additional functional criteria and insurance authorization.  Follow Up Recommendations No PT follow up      Assistance Recommended at Discharge PRN  Patient can return home with the following  Help with stairs or ramp for entrance    Equipment Recommendations None recommended by PT  Recommendations for Other Services       Functional Status Assessment Patient has had a recent decline in their functional status and demonstrates the ability to make  significant improvements in function in a reasonable and predictable amount of time.     Precautions / Restrictions Precautions Precautions: None Restrictions Weight Bearing Restrictions: No      Mobility  Bed Mobility Overal bed mobility: Independent               Patient Response: Cooperative  Transfers Overall transfer level: Independent Equipment used: None                    Ambulation/Gait Ambulation/Gait assistance: Supervision Gait Distance (Feet): 150 Feet Assistive device: None Gait Pattern/deviations: WFL(Within Functional Limits)       General Gait Details: no LOB or path deviation noted  Stairs            Wheelchair Mobility    Modified Rankin (Stroke Patients Only)       Balance Overall balance assessment: Independent                                           Pertinent Vitals/Pain Pain Assessment Pain Assessment: 0-10 Pain Score: 2  Pain Location: Right shoulder Pain Descriptors / Indicators: Aching Pain Intervention(s): Monitored during session, Premedicated before session    Home Living Family/patient expects to be discharged to:: Private residence Living Arrangements: Spouse/significant other Available Help at Discharge: Family Type of Home: House Home Access: Stairs to enter Entrance Stairs-Rails: Right;Left;Can reach both Entrance Stairs-Number of Steps: 2  Home Layout: One level Home Equipment: Grab bars - tub/shower      Prior Function Prior Level of Function : Independent/Modified Independent                     Hand Dominance   Dominant Hand: Right    Extremity/Trunk Assessment   Upper Extremity Assessment Upper Extremity Assessment: Overall WFL for tasks assessed    Lower Extremity Assessment Lower Extremity Assessment: Overall WFL for tasks assessed    Cervical / Trunk Assessment Cervical / Trunk Assessment: Normal  Communication   Communication: No difficulties   Cognition Arousal/Alertness: Awake/alert Behavior During Therapy: WFL for tasks assessed/performed Overall Cognitive Status: Within Functional Limits for tasks assessed                                          General Comments      Exercises     Assessment/Plan    PT Assessment Patient does not need any further PT services  PT Problem List         PT Treatment Interventions      PT Goals (Current goals can be found in the Care Plan section)  Acute Rehab PT Goals Patient Stated Goal: return home PT Goal Formulation: With patient/family Time For Goal Achievement: 03/10/22    Frequency       Co-evaluation               AM-PAC PT "6 Clicks" Mobility  Outcome Measure Help needed turning from your back to your side while in a flat bed without using bedrails?: None Help needed moving from lying on your back to sitting on the side of a flat bed without using bedrails?: None Help needed moving to and from a bed to a chair (including a wheelchair)?: None Help needed standing up from a chair using your arms (e.g., wheelchair or bedside chair)?: None Help needed to walk in hospital room?: None Help needed climbing 3-5 steps with a railing? : A Little 6 Click Score: 23    End of Session   Activity Tolerance: Patient tolerated treatment well Patient left: in chair;with family/visitor present;with call bell/phone within reach   PT Visit Diagnosis: Pain;Other symptoms and signs involving the nervous system (R29.898) Pain - Right/Left: Right Pain - part of body: Shoulder    Time: 1165-7903 PT Time Calculation (min) (ACUTE ONLY): 16 min   Charges:              10:35 AM, 02/25/22 Terin Cragle Small Clois Montavon MPT [place] [facility] [place] [phone] OV:[phone]

## 2022-02-25 NOTE — Progress Notes (Incomplete)
*  PRELIMINARY RESULTS* Echocardiogram 2D Echocardiogram has been performed.  Desiree Flores 02/25/2022, 9:17 AM

## 2022-02-25 NOTE — Progress Notes (Addendum)
Desiree Flores, is a 76 y.o. female  DOB [birth year]  MRN [id number].  Admission date:  02/24/2022  Admitting Physician  No admitting provider for patient encounter.  Discharge Date:  02/25/2022   Primary MD  Caryl Bis, MD  Recommendations for primary care physician for things to follow:   1)Take baby aspirin and atorvastatin/Lipitor for stroke prevention 2)Avoid ibuprofen/Advil/Aleve/Motrin/Goody Powders/Naproxen/BC powders/Meloxicam/Diclofenac/Indomethacin and other Nonsteroidal anti-inflammatory medications as these will make you more likely to bleed and can cause stomach ulcers, can also cause Kidney problems.  3) follow-up primary care physician in a couple weeks for recheck and reevaluation  Admission Diagnosis  Numbness and tingling in left hand [R20.0, R20.2] Finger numbness [R20.0]   Discharge Diagnosis  Numbness and tingling in left hand [R20.0, R20.2] Finger numbness [R20.0]    Principal Problem:   Finger numbness Active Problems:   GERD (gastroesophageal reflux disease)   Hypothyroidism      Past Medical History:  Diagnosis Date   Diverticulitis    Hypothyroidism     Past Surgical History:  Procedure Laterality Date   APPENDECTOMY     6-7 yrs ago   COLONOSCOPY     COLONOSCOPY N/A 01/28/2014   Procedure: COLONOSCOPY;  Surgeon: Rogene Houston, MD;  [facility];  Service: Endoscopy;  Laterality: N/A;  200   DILATION AND CURETTAGE OF UTERUS     miscarriage   ESOPHAGEAL DILATION  08/03/2015   Procedure: ESOPHAGEAL DILATION;  Surgeon: Rogene Houston, MD;  [facility]: [facility];  Service: Endoscopy;;   ESOPHAGEAL DILATION N/A 09/19/2017   Procedure: ESOPHAGEAL DILATION;  Surgeon: Rogene Houston, MD;  [facility];  Service: Endoscopy;  Laterality: N/A;   ESOPHAGEAL DILATION N/A 07/15/2020   Procedure: ESOPHAGEAL DILATION;  Surgeon: Rogene Houston, MD;  [facility];  Service: Endoscopy;  Laterality: N/A;   ESOPHAGOGASTRODUODENOSCOPY N/A 08/03/2015   Procedure: ESOPHAGOGASTRODUODENOSCOPY (EGD);  Surgeon: Rogene Houston, MD;  [facility];  Service: Endoscopy;  Laterality: N/A;   ESOPHAGOGASTRODUODENOSCOPY N/A 09/19/2017   Procedure: ESOPHAGOGASTRODUODENOSCOPY (EGD);  Surgeon: Rogene Houston, MD;  [facility];  Service: Endoscopy;  Laterality: N/A;  12:40-rescheduled to 5/15 @ 7:30am per Lelon Frohlich   ESOPHAGOGASTRODUODENOSCOPY N/A 07/15/2020   Procedure: ESOPHAGOGASTRODUODENOSCOPY (EGD);  Surgeon: Rogene Houston, MD;  [facility];  Service: Endoscopy;  Laterality: N/A;  AM    HPI  from the history and physical done on the day of admission:   HPI: Desiree Flores is a 76 y.o. female with medical history significant of hypothyroidism, GERD.  She presents with sudden onset of numbness that involves her left index finger and thumb, and little finger that started at 215 today.  Symptoms occurred all of a sudden and was accompanied by a difficulty moving her fingers.  Her symptoms lasted for a few minutes and then resolved.  She does report a intermittent mild headache for the past 2 weeks.  She has been taking Tylenol or  Advil as needed.  She denies chest pain, shortness of breath, arm pain, neck pain.   Review of Systems: As mentioned in the history of present illness. All other systems reviewed and are negative.     Hospital Course:     Assessment and Plan: 1) presumed TIA---Pt had Headaches for last few days...on 02/24/22  she had visual disturbance .........and Lt hand numbness.....---all her symptoms have since Resolved -No further neuro concerns -No history of chronic headaches or migraines per se -Neurology consult appreciated -Case reviewed with Dr. Rory Percy -MRI brain without acute neuro findings -CTA head and neck without LVO , no hemodynamically significant stenosis or other acute  concerns -CT head without contrast without acute findings except for acute on chronic right maxillary sinusitis -LDL 93, HDL 38, total cholesterol 161 triglycerides 149 -A1c 5.2 -On telemetry no significant arrhythmias -- Echo requested on echo reading/report pending at the time of discharge -Discharged on aspirin 81 mg daily and Lipitor stroke prophylaxis  2) hypothyroidism----continue atorvastatin  30GERD--- continue Protonix  Discharge Condition: stable  Follow UP---PCP for recheck and reevaluation   Consults obtained - Neuro  Diet and Activity recommendation:  As advised  Discharge Instructions    Discharge Instructions     Call MD for:  difficulty breathing, headache or visual disturbances   Complete by: As directed    Call MD for:  persistant dizziness or light-headedness   Complete by: As directed    Call MD for:  persistant nausea and vomiting   Complete by: As directed    Call MD for:  temperature >100.4   Complete by: As directed    Diet - low sodium heart healthy   Complete by: As directed    Discharge instructions   Complete by: As directed    1)Take baby aspirin and atorvastatin/Lipitor for stroke prevention 2)Avoid ibuprofen/Advil/Aleve/Motrin/Goody Powders/Naproxen/BC powders/Meloxicam/Diclofenac/Indomethacin and other Nonsteroidal anti-inflammatory medications as these will make you more likely to bleed and can cause stomach ulcers, can also cause Kidney problems.  3) follow-up primary care physician in a couple weeks for recheck and reevaluation   Increase activity slowly   Complete by: As directed          Discharge Medications     Allergies as of 02/25/2022       Reactions   Sulfa Antibiotics Other (See Comments)   Yeast infection        Medication List     STOP taking these medications    ciprofloxacin 500 MG tablet Commonly known as: CIPRO   metroNIDAZOLE 500 MG tablet Commonly known as: FLAGYL       TAKE these medications     aspirin EC 81 MG tablet Take 1 tablet (81 mg total) by mouth daily with breakfast.   atorvastatin 40 MG tablet Commonly known as: Lipitor Take 1 tablet (40 mg total) by mouth daily.   CALCIUM-VITAMIN D3 PO Take 1 tablet by mouth 2 (two) times daily.   GLUCOSAMINE PO Take 2 tablets by mouth daily in the afternoon.   levothyroxine 100 MCG tablet Commonly known as: SYNTHROID Take 100 mcg by mouth daily before breakfast.   multivitamin with minerals Tabs tablet Take 1 tablet by mouth daily in the afternoon.   pantoprazole 40 MG tablet Commonly known as: PROTONIX TAKE 1 TABLET (40 MG TOTAL) BY MOUTH TWICE A DAY BEFORE MEALS What changed: See the new instructions.   TYLENOL EXTRA STRENGTH PO Take 500 mg by mouth daily as needed (pain or headache).  Major procedures and Radiology Reports - PLEASE review detailed and final reports for all details, in brief -   MR BRAIN WO CONTRAST  Result Date: 02/24/2022 CLINICAL DATA:  Neuro deficit, acute, stroke suspected EXAM: MRI HEAD WITHOUT CONTRAST TECHNIQUE: Multiplanar, multiecho pulse sequences of the brain and surrounding structures were obtained without intravenous contrast. COMPARISON:  CT head from the same day. FINDINGS: Brain: No acute infarction, hemorrhage, hydrocephalus, extra-axial collection or mass lesion. Patchy T2/FLAIR hyperintensities within the white matter, nonspecific but compatible with chronic microvascular ischemic disease. Vascular: Major arterial flow voids are maintained at the skull base. Skull and upper cervical spine: Normal marrow signal. Sinuses/Orbits: Paranasal sinus mucosal thickening. No acute orbital findings. Other: No mastoid effusions. IMPRESSION: No evidence of acute intracranial abnormality. Electronically Signed   By: Margaretha Sheffield M.D.   On: 02/24/2022 16:59   CT ANGIO HEAD NECK W WO CM (CODE STROKE)  Result Date: 02/24/2022 CLINICAL DATA:  Stroke, follow up EXAM: CT ANGIOGRAPHY  HEAD AND NECK TECHNIQUE: Multidetector CT imaging of the head and neck was performed using the standard protocol during bolus administration of intravenous contrast. Multiplanar CT image reconstructions and MIPs were obtained to evaluate the vascular anatomy. Carotid stenosis measurements (when applicable) are obtained utilizing NASCET criteria, using the distal internal carotid diameter as the denominator. RADIATION DOSE REDUCTION: This exam was performed according to the departmental dose-optimization program which includes automated exposure control, adjustment of the mA and/or kV according to patient size and/or use of iterative reconstruction technique. CONTRAST:  142m OMNIPAQUE IOHEXOL 350 MG/ML SOLN COMPARISON:  Same day CT head. CTA from March 27, 2018 is not retrievable at the time of dictation. FINDINGS: CTA NECK FINDINGS Aortic arch: Great vessel origins are patent without significant stenosis. Right carotid system: No evidence of dissection, stenosis (50% or greater), or occlusion. Retropharyngeal course. Left carotid system: No evidence of dissection, stenosis (50% or greater), or occlusion. Vertebral arteries: Left dominant. No evidence of dissection, stenosis (50% or greater), or occlusion. Skeleton: Moderate multilevel degenerative change. No acute fracture. Other neck: No acute findings. Upper chest: Biapical pleuroparenchymal scarring. No consolidation in the visualized lung apices. Review of the MIP images confirms the above findings CTA HEAD FINDINGS Anterior circulation: Bilateral intracranial ICAs, MCAs, and ACAs are patent without proximal hemodynamically significant stenosis. Posterior circulation: Bilateral intradural vertebral arteries, basilar artery and bilateral posterior cerebral arteries are patent without proximal hemodynamically significant stenosis. Mild narrowing of the mid right vertebral artery due to calcified atherosclerosis. Venous sinuses: As permitted by contrast timing,  patent. Review of the MIP images confirms the above findings IMPRESSION: No emergent large vessel occlusion or proximal hemodynamically significant stenosis. Electronically Signed   By: FMargaretha SheffieldM.D.   On: 02/24/2022 16:16   CT HEAD CODE STROKE WO CONTRAST`  Result Date: 02/24/2022 CLINICAL DATA:  Code stroke. Intermittent headaches for 2 weeks. Visual disturbance left eye for 2 days. Numbness and tingling left hand earlier today that has since resolved. EXAM: CT HEAD WITHOUT CONTRAST TECHNIQUE: Contiguous axial images were obtained from the base of the skull through the vertex without intravenous contrast. RADIATION DOSE REDUCTION: This exam was performed according to the departmental dose-optimization program which includes automated exposure control, adjustment of the mA and/or kV according to patient size and/or use of iterative reconstruction technique. COMPARISON:  Report of CTA head and neck 03/27/2018 at [facility] Brain: Periventricular and subcortical white matter hypoattenuation is greatest in the anterior frontal lobes bilaterally. Areas of hypoattenuation are again  noted into the anterior limb of the internal capsule bilaterally in the right greater than left external capsule. Basal ganglia are within normal limits. Thalami are normal. No acute infarct, hemorrhage, or mass lesion is present. The ventricles are of normal size. No significant extraaxial fluid collection is present. The craniocervical junction is normal. Upper cervical spine is within normal limits. Marrow signal is unremarkable. Dense calcifications are present in the pineal gland. No soft tissue mass is present. Vascular: Atherosclerotic calcifications are present within the cavernous internal carotid arteries bilaterally and at the dural margins of both vertebral arteries. No hyperdense vessel is present. Skull: Calvarium is intact. No focal lytic or blastic lesions are present. No significant extracranial  soft tissue lesion is present. Sinuses/Orbits: Fluid is present in the right maxillary sinus. Mild mucosal thickening is present left maxillary sinus and anterior ethmoid air cells bilaterally. Secretions are noted in the right sphenoid sinus. The paranasal sinuses and mastoid air cells are otherwise clear. The globes and orbits are within normal limits. ASPECTS [facility] Stroke Program Early CT Score) - Ganglionic level infarction (caudate, lentiform nuclei, internal capsule, insula, M1-M3 cortex): 7/7 - Supraganglionic infarction (M4-M6 cortex): 3/3 Total score (0-10 with 10 being normal): 10/10 IMPRESSION: 1. No acute intracranial abnormality or significant interval change. 2. Stable atrophy and white matter disease. This likely reflects the sequela of chronic microvascular ischemia. 3. Aspects is 10/10. 4. Acute on chronic right maxillary sinus disease. These results were called by telephone at the time of interpretation on 02/24/2022 at 3:43 pm to provider [facility] , who verbally acknowledged these results. Electronically Signed   By: San Morelle M.D.   On: 02/24/2022 15:44    Today   Subjective    Desiree Flores today has no new concerns  -Husband at bedside questions answered -Patient ambulating in hallways without dizziness no headaches no dyspnea on exertion no palpitations , no leg pains  , no chest pains or pleuritic symptoms No fever  Or chills  -No further headaches -No numbness or tingling no focal extremity weakness -No speech concerns  No Nausea, Vomiting or Diarrhea   Patient has been seen and examined prior to discharge   Objective   Blood pressure 110/64, pulse 64, temperature 97.9 F (36.6 C), resp. rate 20, height '5\' 6"'$  (1.676 m), weight 74.2 kg, SpO2 97 %.   Intake/Output Summary (Last 24 hours) at 02/25/2022 1756 Last data filed at 02/25/2022 0500 Gross per 24 hour  Intake 360 ml  Output --  Net 360 ml    Exam Gen:- Awake Alert, no acute  distress  HEENT:- [place].AT, No sclera icterus Neck-Supple Neck,No JVD,.  Lungs-  CTAB , good air movement bilaterally CV- S1, S2 normal, regular Abd-  +ve B.Sounds, Abd Soft, No tenderness,    Extremity/Skin:- No  edema,   good pulses Psych-affect is appropriate, oriented x3 Neuro-no new focal deficits, no tremors    Data Review   CBC w Diff:  Lab Results  Component Value Date   WBC 5.4 02/24/2022   HGB 15.2 (H) 02/24/2022   HCT 44.8 02/24/2022   PLT 189 02/24/2022   LYMPHOPCT 32 02/24/2022   MONOPCT 8 02/24/2022   EOSPCT 4 02/24/2022   BASOPCT 1 02/24/2022    CMP:  Lab Results  Component Value Date   NA 140 02/24/2022   K 3.9 02/24/2022   CL 109 02/24/2022   CO2 23 02/24/2022   BUN 23 02/24/2022   CREATININE 0.91 02/24/2022   PROT 7.6  02/24/2022   ALBUMIN 4.3 02/24/2022   BILITOT 0.7 02/24/2022   ALKPHOS 70 02/24/2022   AST 34 02/24/2022   ALT 50 (H) 02/24/2022   Total Discharge time is about 33 minutes  Roxan Hockey M.D on 02/25/2022 at 5:56 PM  Go to [email or web address] -  for contact info  [facility] - Office  [phone]

## 2022-02-26 NOTE — Discharge Summary (Signed)
Please see discharge summary dictated 02/25/2022 that was erroneously labeled as a progress note   -The completed note erroneously [place] as a progress note dated 02/25/2022 is actually discharge summary not a progress note  Roxan Hockey, MD

## 2022-03-01 DIAGNOSIS — Z23 Encounter for immunization: Secondary | ICD-10-CM | POA: Diagnosis not present

## 2022-03-06 NOTE — Progress Notes (Signed)
I don't think I saw that patient. However, the PTT is part of the stroke order set. So, neurology probably wants it.   Ronalee Belts

## 2022-03-08 ENCOUNTER — Other Ambulatory Visit (INDEPENDENT_AMBULATORY_CARE_PROVIDER_SITE_OTHER): Payer: Self-pay | Admitting: Internal Medicine

## 2022-03-27 DIAGNOSIS — G459 Transient cerebral ischemic attack, unspecified: Secondary | ICD-10-CM | POA: Diagnosis not present

## 2022-05-15 DIAGNOSIS — R030 Elevated blood-pressure reading, without diagnosis of hypertension: Secondary | ICD-10-CM | POA: Diagnosis not present

## 2022-05-15 DIAGNOSIS — Z6825 Body mass index (BMI) 25.0-25.9, adult: Secondary | ICD-10-CM | POA: Diagnosis not present

## 2022-05-15 DIAGNOSIS — S0990XA Unspecified injury of head, initial encounter: Secondary | ICD-10-CM | POA: Diagnosis not present

## 2022-05-30 ENCOUNTER — Encounter (INDEPENDENT_AMBULATORY_CARE_PROVIDER_SITE_OTHER): Payer: Self-pay | Admitting: Gastroenterology

## 2022-06-08 ENCOUNTER — Encounter (HOSPITAL_COMMUNITY): Payer: Self-pay

## 2022-06-08 ENCOUNTER — Other Ambulatory Visit: Payer: Self-pay

## 2022-06-08 ENCOUNTER — Emergency Department (HOSPITAL_COMMUNITY)
Admission: EM | Admit: 2022-06-08 | Discharge: 2022-06-08 | Disposition: A | Discharge status: Home / Self Care | Payer: Medicare HMO | Attending: Emergency Medicine | Admitting: Emergency Medicine

## 2022-06-08 DIAGNOSIS — Z7982 Long term (current) use of aspirin: Secondary | ICD-10-CM | POA: Diagnosis not present

## 2022-06-08 DIAGNOSIS — R110 Nausea: Secondary | ICD-10-CM | POA: Insufficient documentation

## 2022-06-08 DIAGNOSIS — R197 Diarrhea, unspecified: Secondary | ICD-10-CM | POA: Insufficient documentation

## 2022-06-08 LAB — CBC
HCT: 48.1 % — ABNORMAL HIGH (ref 36.0–46.0)
Hemoglobin: 16.1 g/dL — ABNORMAL HIGH (ref 12.0–15.0)
MCH: 29.5 pg (ref 26.0–34.0)
MCHC: 33.5 g/dL (ref 30.0–36.0)
MCV: 88.1 fL (ref 80.0–100.0)
Platelets: 190 10*3/uL (ref 150–400)
RBC: 5.46 MIL/uL — ABNORMAL HIGH (ref 3.87–5.11)
RDW: 13.3 % (ref 11.5–15.5)
WBC: 8.2 10*3/uL (ref 4.0–10.5)
nRBC: 0 % (ref 0.0–0.2)

## 2022-06-08 LAB — URINALYSIS, ROUTINE W REFLEX MICROSCOPIC
Bilirubin Urine: NEGATIVE
Glucose, UA: NEGATIVE mg/dL
Hgb urine dipstick: NEGATIVE
Ketones, ur: NEGATIVE mg/dL
Leukocytes,Ua: NEGATIVE
Nitrite: NEGATIVE
Protein, ur: 30 mg/dL — AB
Specific Gravity, Urine: 1.025 (ref 1.005–1.030)
pH: 5 (ref 5.0–8.0)

## 2022-06-08 LAB — COMPREHENSIVE METABOLIC PANEL
ALT: 37 U/L (ref 0–44)
AST: 30 U/L (ref 15–41)
Albumin: 4.3 g/dL (ref 3.5–5.0)
Alkaline Phosphatase: 79 U/L (ref 38–126)
Anion gap: 11 (ref 5–15)
BUN: 20 mg/dL (ref 8–23)
CO2: 19 mmol/L — ABNORMAL LOW (ref 22–32)
Calcium: 9.3 mg/dL (ref 8.9–10.3)
Chloride: 108 mmol/L (ref 98–111)
Creatinine, Ser: 0.93 mg/dL (ref 0.44–1.00)
GFR, Estimated: >60 mL/min (ref >60)
Glucose, Bld: 117 mg/dL — ABNORMAL HIGH (ref 70–99)
Potassium: 4 mmol/L (ref 3.5–5.1)
Sodium: 138 mmol/L (ref 135–145)
Total Bilirubin: 1.1 mg/dL (ref 0.3–1.2)
Total Protein: 7.9 g/dL (ref 6.5–8.1)

## 2022-06-08 LAB — LIPASE, BLOOD: Lipase: 49 U/L (ref 11–51)

## 2022-06-08 MED ORDER — SODIUM CHLORIDE 0.9 % IV BOLUS
500.0000 mL | Freq: Once | INTRAVENOUS | Status: AC
  Administered 2022-06-08: 500 mL via INTRAVENOUS

## 2022-06-08 MED ORDER — ONDANSETRON 4 MG PO TBDP: 4.0000 mg | ORAL_TABLET | Freq: Three times a day (TID) | ORAL | 0 refills | Status: DC | PRN

## 2022-06-08 MED ORDER — ONDANSETRON HCL 4 MG/2ML IJ SOLN
4.0000 mg | Freq: Once | INTRAMUSCULAR | Status: AC
  Administered 2022-06-08: 4 mg via INTRAVENOUS
  Filled 2022-06-08: qty 2

## 2022-06-08 NOTE — ED Provider Notes (Signed)
[facility] Provider Note   CSN: [id number] Arrival date & time: 06/08/22  1313     History  Chief Complaint  Patient presents with   Diarrhea    TONESHA TSOU is a 77 y.o. female.   Diarrhea Patient presents with nausea and diarrhea.  Began last night.  Started with diarrhea and now nausea.  No real abdominal pain.  No known sick contacts.  States she does not deal well with nausea.  No fevers or chills.     Home Medications Prior to Admission medications   Medication Sig Start Date End Date Taking? Authorizing Provider  Acetaminophen (TYLENOL EXTRA STRENGTH PO) Take 500 mg by mouth daily as needed (pain or headache).   Yes [provider]  aspirin EC 81 MG tablet Take 1 tablet (81 mg total) by mouth daily with breakfast. 02/25/22 02/25/23 Yes Emokpae, Courage, MD  atorvastatin (LIPITOR) 40 MG tablet Take 1 tablet (40 mg total) by mouth daily. 02/25/22 02/25/23 Yes Emokpae, Courage, MD  Calcium Carbonate-Vitamin D (CALCIUM-VITAMIN D3 PO) Take 1 tablet by mouth 2 (two) times daily.   Yes [provider]  Glucosamine HCl (GLUCOSAMINE PO) Take 2 tablets by mouth daily in the afternoon.   Yes [provider]  levothyroxine (SYNTHROID, LEVOTHROID) 100 MCG tablet Take 100 mcg by mouth daily before breakfast.   Yes [provider]  Multiple Vitamin (MULTIVITAMIN WITH MINERALS) TABS tablet Take 1 tablet by mouth daily in the afternoon.   Yes [provider]  ondansetron (ZOFRAN-ODT) 4 MG disintegrating tablet Take 1 tablet (4 mg total) by mouth every 8 (eight) hours as needed for nausea or vomiting. 06/08/22  Yes Davonna Belling, MD  pantoprazole (PROTONIX) 40 MG tablet TAKE 1 TABLET (40 MG TOTAL) BY MOUTH TWICE A DAY BEFORE MEALS Patient taking differently: Take 40 mg by mouth daily. 03/08/22  Yes Harvel Quale, MD      Allergies    Sulfa antibiotics    Review of Systems    Review of Systems  Gastrointestinal:  Positive for diarrhea.    Physical Exam Updated Vital Signs BP 122/63   Pulse 87   Temp 98.9 F (37.2 C) (Oral)   Resp 17   Ht '5\' 6"'$  (1.676 m)   Wt 68 kg   SpO2 93%   BMI 24.21 kg/m  Physical Exam Vitals and nursing note reviewed.  HENT:     Head: Atraumatic.  Eyes:     Pupils: Pupils are equal, round, and reactive to light.  Cardiovascular:     Rate and Rhythm: Regular rhythm. Tachycardia present.  Abdominal:     Tenderness: There is no abdominal tenderness.  Musculoskeletal:        General: No tenderness.  Skin:    General: Skin is warm.  Neurological:     Mental Status: She is alert and oriented to person, place, and time.     ED Results / Procedures / Treatments   Labs (all labs ordered are listed, but only abnormal results are displayed) Labs Reviewed  COMPREHENSIVE METABOLIC PANEL - Abnormal; Notable for the following components:      Result Value   CO2 19 (*)    Glucose, Bld 117 (*)    All other components within normal limits  CBC - Abnormal; Notable for the following components:   RBC 5.46 (*)    Hemoglobin 16.1 (*)    HCT 48.1 (*)    All other components within  normal limits  URINALYSIS, ROUTINE W REFLEX MICROSCOPIC - Abnormal; Notable for the following components:   APPearance TURBID (*)    Protein, ur 30 (*)    Bacteria, UA RARE (*)    All other components within normal limits  LIPASE, BLOOD    EKG None  Radiology No results found.  Procedures Procedures    Medications Ordered in ED Medications  sodium chloride 0.9 % bolus 500 mL (0 mLs Intravenous Stopped 06/08/22 1628)  ondansetron (ZOFRAN) injection 4 mg (4 mg Intravenous Given 06/08/22 1520)    ED Course/ Medical Decision Making/ A&P                             Medical Decision Making Amount and/or Complexity of Data Reviewed Labs: ordered.  Risk Prescription drug management.   Patient with nausea and diarrhea.  No definite sick  contacts.  Benign exam.  No fevers.  States she does not do well with nausea.  Will get basic blood work.  Will give some IV fluids and antiemetics.  Reviewed previous ER visit and discharge report.  Feels better after fluids.  Lab work reassuring.  Eager to go home.  Will discharge.        Final Clinical Impression(s) / ED Diagnoses Final diagnoses:  Diarrhea, unspecified type  Nausea    Rx / DC Orders ED Discharge Orders          Ordered    ondansetron (ZOFRAN-ODT) 4 MG disintegrating tablet  Every 8 hours PRN        06/08/22 Jerolyn Shin, MD 06/08/22 2303

## 2022-06-08 NOTE — ED Triage Notes (Signed)
Pt reports diarrhea and nausea since last night.

## 2022-07-06 DIAGNOSIS — J301 Allergic rhinitis due to pollen: Secondary | ICD-10-CM | POA: Diagnosis not present

## 2022-07-06 DIAGNOSIS — Z6825 Body mass index (BMI) 25.0-25.9, adult: Secondary | ICD-10-CM | POA: Diagnosis not present

## 2022-07-06 DIAGNOSIS — Z20828 Contact with and (suspected) exposure to other viral communicable diseases: Secondary | ICD-10-CM | POA: Diagnosis not present

## 2022-07-06 DIAGNOSIS — R059 Cough, unspecified: Secondary | ICD-10-CM | POA: Diagnosis not present

## 2022-07-06 DIAGNOSIS — R030 Elevated blood-pressure reading, without diagnosis of hypertension: Secondary | ICD-10-CM | POA: Diagnosis not present

## 2022-07-20 ENCOUNTER — Ambulatory Visit (INDEPENDENT_AMBULATORY_CARE_PROVIDER_SITE_OTHER): Payer: Medicare HMO | Admitting: Gastroenterology

## 2022-08-01 ENCOUNTER — Encounter (INDEPENDENT_AMBULATORY_CARE_PROVIDER_SITE_OTHER): Payer: Self-pay | Admitting: Gastroenterology

## 2022-08-01 ENCOUNTER — Ambulatory Visit (INDEPENDENT_AMBULATORY_CARE_PROVIDER_SITE_OTHER): Payer: Medicare HMO | Admitting: Gastroenterology

## 2022-08-01 VITALS — BP 132/72 | HR 80 | Temp 98.1°F | Ht 66.0 in | Wt 160.9 lb

## 2022-08-01 DIAGNOSIS — K219 Gastro-esophageal reflux disease without esophagitis: Secondary | ICD-10-CM

## 2022-08-01 DIAGNOSIS — R09A2 Foreign body sensation, throat: Secondary | ICD-10-CM | POA: Diagnosis not present

## 2022-08-01 MED ORDER — OMEPRAZOLE 40 MG PO CPDR: 40.0000 mg | DELAYED_RELEASE_CAPSULE | Freq: Every day | ORAL | 5 refills | Status: DC

## 2022-08-01 NOTE — Progress Notes (Signed)
Referring Provider: Caryl Bis, MD Primary Care Physician:  Caryl Bis, MD Primary GI Physician: Jenetta Downer   Chief Complaint  Patient presents with   Gastroesophageal Reflux    Follow up on GERD. Takes pantoprazole once daily and sometimes twice daily. Reports working well and no concerns.    HPI:   Desiree Flores is a 77 y.o. female with past medical history of reflux esophagitis, esophageal stricture, diverticulitis   Patient presenting today for follow up of GERD.  Last seen march 2023, at that time, doing well taking protonix 3x/week, rare heartburn but some atypical chest pain.  Recommended to continue with PPI every day  Present:  Doing well with protonix 40mg  daily. She notes she has not had any heartburn or acid regurgitation in a long time, she continues to have very occasional chest pain, noting she sometimes takes a tums and sometimes takes an aspirin as she is unsure if this is GERD or a heart issue. She has rare dysphagia, usually only with a thicker meat. If she eats this, usually within 15-20 minutes food will pass down. She notes no episodes of this in about 2 years. She tries to avoid thicker, drier foods like this. She does note that she has a globus sensation, she has discussed this with Dr. Laural Golden in the past as well and was told it was related to her GERD though feels it is always present and somewhat irritating. Has been maintained on protonix since atleast 2017. No red flag symptoms. Patient denies melena, hematochezia, nausea, vomiting, diarrhea, constipation, dysphagia, odyonophagia, early satiety or weight loss.     Last Colonoscopy:01/2014 Examination performed to cecum. Sigmoid colon diverticulosis otherwise normal colonoscopy.  Last Endoscopy:07/2020 - Normal hypopharynx.                           - Normal esophagus.                           - Benign-appearing esophageal stenosis. Dilated.                           - 2 cm hiatal hernia.                            - Normal stomach.                           - Normal duodenal bulb and second portion of the                            duodenum.                           - No specimens collected.  Recommendations:  Repeat TCS in 10 years   Past Medical History:  Diagnosis Date   Diverticulitis    Hypothyroidism     Past Surgical History:  Procedure Laterality Date   APPENDECTOMY     6-7 yrs ago   COLONOSCOPY     COLONOSCOPY N/A 01/28/2014   Procedure: COLONOSCOPY;  Surgeon: Rogene Houston, MD;  [facility];  Service: Endoscopy;  Laterality: N/A;  200   DILATION AND CURETTAGE OF UTERUS  miscarriage   ESOPHAGEAL DILATION  08/03/2015   Procedure: ESOPHAGEAL DILATION;  Surgeon: Rogene Houston, MD;  [facility];  Service: Endoscopy;;   ESOPHAGEAL DILATION N/A 09/19/2017   Procedure: ESOPHAGEAL DILATION;  Surgeon: Rogene Houston, MD;  [facility];  Service: Endoscopy;  Laterality: N/A;   ESOPHAGEAL DILATION N/A 07/15/2020   Procedure: ESOPHAGEAL DILATION;  Surgeon: Rogene Houston, MD;  [facility];  Service: Endoscopy;  Laterality: N/A;   ESOPHAGOGASTRODUODENOSCOPY N/A 08/03/2015   Procedure: ESOPHAGOGASTRODUODENOSCOPY (EGD);  Surgeon: Rogene Houston, MD;  [facility];  Service: Endoscopy;  Laterality: N/A;   ESOPHAGOGASTRODUODENOSCOPY N/A 09/19/2017   Procedure: ESOPHAGOGASTRODUODENOSCOPY (EGD);  Surgeon: Rogene Houston, MD;  [facility];  Service: Endoscopy;  Laterality: N/A;  12:40-rescheduled to 5/15 @ 7:30am per Lelon Frohlich   ESOPHAGOGASTRODUODENOSCOPY N/A 07/15/2020   Procedure: ESOPHAGOGASTRODUODENOSCOPY (EGD);  Surgeon: Rogene Houston, MD;  [facility];  Service: Endoscopy;  Laterality: N/A;  AM    Current Outpatient Medications  Medication Sig Dispense Refill   aspirin EC 81 MG tablet Take 1 tablet (81 mg total) by mouth daily with breakfast. 30 tablet 2   atorvastatin (LIPITOR) 40 MG  tablet Take 1 tablet (40 mg total) by mouth daily. 90 tablet 3   Calcium Carbonate-Vitamin D (CALCIUM-VITAMIN D3 PO) Take 1 tablet by mouth 2 (two) times daily.     Glucosamine HCl (GLUCOSAMINE PO) Take 2 tablets by mouth daily in the afternoon.     levothyroxine (SYNTHROID, LEVOTHROID) 100 MCG tablet Take 100 mcg by mouth daily before breakfast.     Multiple Vitamin (MULTIVITAMIN WITH MINERALS) TABS tablet Take 1 tablet by mouth daily in the afternoon.     pantoprazole (PROTONIX) 40 MG tablet TAKE 1 TABLET (40 MG TOTAL) BY MOUTH TWICE A DAY BEFORE MEALS (Patient taking differently: Take 40 mg by mouth daily.) 180 tablet 0   Acetaminophen (TYLENOL EXTRA STRENGTH PO) Take 500 mg by mouth daily as needed (pain or headache).     No current facility-administered medications for this visit.    Allergies as of 08/01/2022 - Review Complete 08/01/2022  Allergen Reaction Noted   Sulfa antibiotics Other (See Comments) 12/15/2013    Family History  Problem Relation Age of Onset   Colon cancer Other     Social History   Socioeconomic History   Marital status: Married    Spouse name: Not on file   Number of children: Not on file   Years of education: Not on file   Highest education level: Not on file  Occupational History   Not on file  Tobacco Use   Smoking status: Never    Passive exposure: Past   Smokeless tobacco: Never  Vaping Use   Vaping Use: Never used  Substance and Sexual Activity   Alcohol use: No   Drug use: No   Sexual activity: Not Currently  Other Topics Concern   Not on file  Social History Narrative   Not on file   Social Determinants of Health   Financial Resource Strain: Not on file  Food Insecurity: No Food Insecurity (02/24/2022)   Hunger Vital Sign    Worried About Running Out of Food in the Last Year: Never true    Ran Out of Food in the Last Year: Never true  Transportation Needs: No Transportation Needs (02/24/2022)   PRAPARE - Transportation    Lack  of Transportation (Medical): No    Lack  of Transportation (Non-Medical): No  Physical Activity: Not on file  Stress: Not on file  Social Connections: Not on file    Review of systems General: negative for malaise, night sweats, fever, chills, weight loss Neck: Negative for lumps, goiter, pain and significant neck swelling Resp: Negative for cough, wheezing, dyspnea at rest CV: Negative for chest pain, leg swelling, palpitations, orthopnea GI: denies melena, hematochezia, nausea, vomiting, diarrhea, constipation, dysphagia, odyonophagia, early satiety or unintentional weight loss. +globus sensation  MSK: Negative for joint pain or swelling, back pain, and muscle pain. Derm: Negative for itching or rash Psych: Denies depression, anxiety, memory loss, confusion. No homicidal or suicidal ideation.  Heme: Negative for prolonged bleeding, bruising easily, and swollen nodes. Endocrine: Negative for cold or heat intolerance, polyuria, polydipsia and goiter. Neuro: negative for tremor, gait imbalance, syncope and seizures. The remainder of the review of systems is noncontributory.  Physical Exam: BP 132/72 (BP Location: Left Arm, Patient Position: Sitting, Cuff Size: Large)   Pulse 80   Temp 98.1 F (36.7 C) (Oral)   Ht 5\' 6"  (1.676 m)   Wt 160 lb 14.4 oz (73 kg)   BMI 25.97 kg/m  General:   Alert and oriented. No distress noted. Pleasant and cooperative.  Head:  Normocephalic and atraumatic. Eyes:  Conjuctiva clear without scleral icterus. Mouth:  Oral mucosa pink and moist. Good dentition. No lesions. Heart: Normal rate and rhythm, s1 and s2 heart sounds present.  Lungs: Clear lung sounds in all lobes. Respirations equal and unlabored. Abdomen:  +BS, soft, non-tender and non-distended. No rebound or guarding. No HSM or masses noted. Derm: No palmar erythema or jaundice Msk:  Symmetrical without gross deformities. Normal posture. Extremities:  Without edema. Neurologic:  Alert and   oriented x4 Psych:  Alert and cooperative. Normal mood and affect.  Invalid input(s): "6 MONTHS"   ASSESSMENT: Desiree Flores is a 77 y.o. female presenting today for follow up of GERD.  GERD/globus sensation: previously thought that atypical chest pain was secondary to her GERD. She is maintained on protonix 40mg  daily, having occasional episodes still of chest pain, usually resolved with taking a tums. No heartburn or acid regurgitation though noting a globus sensation she has had for a few years. Suspect globus sensation may be secondary to her GERD being uncontrolled. She has not had dysphagia in a couple of years. Will stop protonix and start omeprazole 40mg  once daily. She should let me know if globus sensation does not improve. We discussed ER precautions for chest pain, if she has associated nausea, pain in jaw or face, SOB, diaphoresis or chest pain does not resolve with antacids, she needs to proceed to the ED.    PLAN:  Stop protonix  2. Start omeprazole 40mg  daily  3. Reflux precautions  4. ER precautions for chest pain   All questions were answered, patient verbalized understanding and is in agreement with plan as outlined above.   Follow Up: 1 year   Shonice Wrisley L. Alver Sorrow, MSN, APRN, AGNP-C Adult-Gerontology Nurse Practitioner [facility]

## 2022-08-01 NOTE — Patient Instructions (Signed)
Stop protonix Start omeprazole 40mg  daily Let me know if sensation in your throat does not improve  Follow up 1 year

## 2022-08-02 DIAGNOSIS — K2100 Gastro-esophageal reflux disease with esophagitis, without bleeding: Secondary | ICD-10-CM | POA: Diagnosis not present

## 2022-08-02 DIAGNOSIS — E7849 Other hyperlipidemia: Secondary | ICD-10-CM | POA: Diagnosis not present

## 2022-08-02 DIAGNOSIS — E039 Hypothyroidism, unspecified: Secondary | ICD-10-CM | POA: Diagnosis not present

## 2022-08-02 DIAGNOSIS — K219 Gastro-esophageal reflux disease without esophagitis: Secondary | ICD-10-CM | POA: Diagnosis not present

## 2022-08-08 DIAGNOSIS — R030 Elevated blood-pressure reading, without diagnosis of hypertension: Secondary | ICD-10-CM | POA: Diagnosis not present

## 2022-08-08 DIAGNOSIS — M8580 Other specified disorders of bone density and structure, unspecified site: Secondary | ICD-10-CM | POA: Diagnosis not present

## 2022-08-08 DIAGNOSIS — E7849 Other hyperlipidemia: Secondary | ICD-10-CM | POA: Diagnosis not present

## 2022-08-08 DIAGNOSIS — K7581 Nonalcoholic steatohepatitis (NASH): Secondary | ICD-10-CM | POA: Diagnosis not present

## 2022-08-08 DIAGNOSIS — K5730 Diverticulosis of large intestine without perforation or abscess without bleeding: Secondary | ICD-10-CM | POA: Diagnosis not present

## 2022-08-08 DIAGNOSIS — Z6825 Body mass index (BMI) 25.0-25.9, adult: Secondary | ICD-10-CM | POA: Diagnosis not present

## 2022-08-08 DIAGNOSIS — K2100 Gastro-esophageal reflux disease with esophagitis, without bleeding: Secondary | ICD-10-CM | POA: Diagnosis not present

## 2022-08-08 DIAGNOSIS — E039 Hypothyroidism, unspecified: Secondary | ICD-10-CM | POA: Diagnosis not present

## 2022-08-15 DIAGNOSIS — H25813 Combined forms of age-related cataract, bilateral: Secondary | ICD-10-CM | POA: Diagnosis not present

## 2022-09-15 ENCOUNTER — Other Ambulatory Visit (INDEPENDENT_AMBULATORY_CARE_PROVIDER_SITE_OTHER): Payer: Self-pay | Admitting: Gastroenterology

## 2022-09-16 DIAGNOSIS — Z6825 Body mass index (BMI) 25.0-25.9, adult: Secondary | ICD-10-CM | POA: Diagnosis not present

## 2022-09-16 DIAGNOSIS — R030 Elevated blood-pressure reading, without diagnosis of hypertension: Secondary | ICD-10-CM | POA: Diagnosis not present

## 2022-09-16 DIAGNOSIS — K117 Disturbances of salivary secretion: Secondary | ICD-10-CM | POA: Diagnosis not present

## 2022-09-18 DIAGNOSIS — M1711 Unilateral primary osteoarthritis, right knee: Secondary | ICD-10-CM | POA: Diagnosis not present

## 2022-09-18 DIAGNOSIS — R5383 Other fatigue: Secondary | ICD-10-CM | POA: Diagnosis not present

## 2022-10-18 DIAGNOSIS — Z1283 Encounter for screening for malignant neoplasm of skin: Secondary | ICD-10-CM | POA: Diagnosis not present

## 2022-10-18 DIAGNOSIS — L821 Other seborrheic keratosis: Secondary | ICD-10-CM | POA: Diagnosis not present

## 2022-10-18 DIAGNOSIS — L661 Lichen planopilaris: Secondary | ICD-10-CM | POA: Diagnosis not present

## 2023-01-16 DIAGNOSIS — H2513 Age-related nuclear cataract, bilateral: Secondary | ICD-10-CM | POA: Diagnosis not present

## 2023-01-16 DIAGNOSIS — H25013 Cortical age-related cataract, bilateral: Secondary | ICD-10-CM | POA: Diagnosis not present

## 2023-01-16 DIAGNOSIS — H25043 Posterior subcapsular polar age-related cataract, bilateral: Secondary | ICD-10-CM | POA: Diagnosis not present

## 2023-01-16 DIAGNOSIS — H2512 Age-related nuclear cataract, left eye: Secondary | ICD-10-CM | POA: Diagnosis not present

## 2023-02-09 DIAGNOSIS — Z1321 Encounter for screening for nutritional disorder: Secondary | ICD-10-CM | POA: Diagnosis not present

## 2023-02-09 DIAGNOSIS — K760 Fatty (change of) liver, not elsewhere classified: Secondary | ICD-10-CM | POA: Diagnosis not present

## 2023-02-09 DIAGNOSIS — Z0001 Encounter for general adult medical examination with abnormal findings: Secondary | ICD-10-CM | POA: Diagnosis not present

## 2023-02-09 DIAGNOSIS — E7849 Other hyperlipidemia: Secondary | ICD-10-CM | POA: Diagnosis not present

## 2023-02-09 DIAGNOSIS — E039 Hypothyroidism, unspecified: Secondary | ICD-10-CM | POA: Diagnosis not present

## 2023-02-16 DIAGNOSIS — Z6824 Body mass index (BMI) 24.0-24.9, adult: Secondary | ICD-10-CM | POA: Diagnosis not present

## 2023-02-16 DIAGNOSIS — K5730 Diverticulosis of large intestine without perforation or abscess without bleeding: Secondary | ICD-10-CM | POA: Diagnosis not present

## 2023-02-16 DIAGNOSIS — K2100 Gastro-esophageal reflux disease with esophagitis, without bleeding: Secondary | ICD-10-CM | POA: Diagnosis not present

## 2023-02-16 DIAGNOSIS — Z23 Encounter for immunization: Secondary | ICD-10-CM | POA: Diagnosis not present

## 2023-02-16 DIAGNOSIS — Z0001 Encounter for general adult medical examination with abnormal findings: Secondary | ICD-10-CM | POA: Diagnosis not present

## 2023-02-16 DIAGNOSIS — M8580 Other specified disorders of bone density and structure, unspecified site: Secondary | ICD-10-CM | POA: Diagnosis not present

## 2023-02-16 DIAGNOSIS — E039 Hypothyroidism, unspecified: Secondary | ICD-10-CM | POA: Diagnosis not present

## 2023-02-16 DIAGNOSIS — E7849 Other hyperlipidemia: Secondary | ICD-10-CM | POA: Diagnosis not present

## 2023-02-16 DIAGNOSIS — K7581 Nonalcoholic steatohepatitis (NASH): Secondary | ICD-10-CM | POA: Diagnosis not present

## 2023-02-27 DIAGNOSIS — Z23 Encounter for immunization: Secondary | ICD-10-CM | POA: Diagnosis not present

## 2023-03-21 DIAGNOSIS — H2512 Age-related nuclear cataract, left eye: Secondary | ICD-10-CM | POA: Diagnosis not present

## 2023-03-22 DIAGNOSIS — H2511 Age-related nuclear cataract, right eye: Secondary | ICD-10-CM | POA: Diagnosis not present

## 2023-04-09 DIAGNOSIS — H2511 Age-related nuclear cataract, right eye: Secondary | ICD-10-CM | POA: Diagnosis not present

## 2023-04-09 DIAGNOSIS — H25041 Posterior subcapsular polar age-related cataract, right eye: Secondary | ICD-10-CM | POA: Diagnosis not present

## 2023-04-09 DIAGNOSIS — H25011 Cortical age-related cataract, right eye: Secondary | ICD-10-CM | POA: Diagnosis not present

## 2023-05-17 DIAGNOSIS — E039 Hypothyroidism, unspecified: Secondary | ICD-10-CM | POA: Diagnosis not present

## 2023-08-06 ENCOUNTER — Ambulatory Visit (INDEPENDENT_AMBULATORY_CARE_PROVIDER_SITE_OTHER): Payer: Medicare HMO | Admitting: Gastroenterology

## 2023-08-06 ENCOUNTER — Encounter (INDEPENDENT_AMBULATORY_CARE_PROVIDER_SITE_OTHER): Payer: Self-pay | Admitting: Gastroenterology

## 2023-08-06 VITALS — BP 124/72 | HR 69 | Temp 97.5°F | Ht 66.5 in | Wt 162.9 lb

## 2023-08-06 DIAGNOSIS — K219 Gastro-esophageal reflux disease without esophagitis: Secondary | ICD-10-CM

## 2023-08-06 DIAGNOSIS — R1310 Dysphagia, unspecified: Secondary | ICD-10-CM

## 2023-08-06 NOTE — Patient Instructions (Signed)
 Continue omeprazole 40 mg qday Try to mix larger vitamin pills with pure like food such as applesauce or yogurt

## 2023-08-06 NOTE — Progress Notes (Unsigned)
 Katrinka Blazing, M.D. Gastroenterology & Hepatology [facility] Gastroenterology [address] [place], [place] [address]  Primary Care Physician: Richardean Chimera, MD [address] [place] [address]  I will communicate my assessment and recommendations to the referring MD via EMR.  Problems: GERD  History of Present Illness: Desiree MENARD is a 78 y.o. female with past medical history of reflux esophagitis, esophageal stricture, diverticulitis coming for follow-up of GERD.  The patient was last seen on 08/01/2022.  At that time, the patient was switched to omeprazole 40 mg daily.  Patient reports that she is taking omeprazole 40 mg qday, although she sometimes forgets to take it at the right time and has it after having a meal. Did not notice any difference between each PPI.  Patient reports that she is having some dysphagia with pills, but not with food or water. Has more issues with glucosamine or vitamins. No odynophagia or heartburn.  The patient denies having any nausea, vomiting, fever, chills, hematochezia, melena, hematemesis, abdominal distention, abdominal pain, diarrhea, jaundice, pruritus or weight loss.  Last Colonoscopy:01/2014 Examination performed to cecum. Sigmoid colon diverticulosis otherwise normal colonoscopy.   Last Endoscopy:07/2020 - Normal hypopharynx.                           - Normal esophagus.                           - Benign-appearing esophageal stenosis. Dilated.                           - 2 cm hiatal hernia.                           - Normal stomach.                           - Normal duodenal bulb and second portion of the                            duodenum.                           - No specimens collected.   Recommendations:  Repeat TCS in 10 years   Past Medical History: Past Medical History:  Diagnosis Date   Diverticulitis    Hypothyroidism     Past Surgical History: Past Surgical History:   Procedure Laterality Date   APPENDECTOMY     6-7 yrs ago   COLONOSCOPY     COLONOSCOPY N/A 01/28/2014   Procedure: COLONOSCOPY;  Surgeon: Malissa Hippo, MD;  [facility];  Service: Endoscopy;  Laterality: N/A;  200   DILATION AND CURETTAGE OF UTERUS     miscarriage   ESOPHAGEAL DILATION  08/03/2015   Procedure: ESOPHAGEAL DILATION;  Surgeon: Malissa Hippo, MD;  [facility];  Service: Endoscopy;;   ESOPHAGEAL DILATION N/A 09/19/2017   Procedure: ESOPHAGEAL DILATION;  Surgeon: Malissa Hippo, MD;  [facility];  Service: Endoscopy;  Laterality: N/A;   ESOPHAGEAL DILATION N/A 07/15/2020   Procedure: ESOPHAGEAL DILATION;  Surgeon: Malissa Hippo, MD;  [facility];  Service: Endoscopy;  Laterality: N/A;  ESOPHAGOGASTRODUODENOSCOPY N/A 08/03/2015   Procedure: ESOPHAGOGASTRODUODENOSCOPY (EGD);  Surgeon: Malissa Hippo, MD;  [facility];  Service: Endoscopy;  Laterality: N/A;   ESOPHAGOGASTRODUODENOSCOPY N/A 09/19/2017   Procedure: ESOPHAGOGASTRODUODENOSCOPY (EGD);  Surgeon: Malissa Hippo, MD;  [facility];  Service: Endoscopy;  Laterality: N/A;  12:40-rescheduled to 5/15 @ 7:30am per Dewayne Hatch   ESOPHAGOGASTRODUODENOSCOPY N/A 07/15/2020   Procedure: ESOPHAGOGASTRODUODENOSCOPY (EGD);  Surgeon: Malissa Hippo, MD;  [facility];  Service: Endoscopy;  Laterality: N/A;  AM    Family History: Family History  Problem Relation Age of Onset   Colon cancer Other     Social History: Social History   Tobacco Use  Smoking Status Never   Passive exposure: Past  Smokeless Tobacco Never   Social History   Substance and Sexual Activity  Alcohol Use No   Social History   Substance and Sexual Activity  Drug Use No    Allergies: Allergies  Allergen Reactions   Sulfa Antibiotics Other (See Comments)    Yeast infection    Medications: Current Outpatient Medications  Medication Sig Dispense Refill    Acetaminophen (TYLENOL EXTRA STRENGTH PO) Take 500 mg by mouth daily as needed (pain or headache).     atorvastatin (LIPITOR) 40 MG tablet Take 1 tablet (40 mg total) by mouth daily. 90 tablet 3   levothyroxine (SYNTHROID, LEVOTHROID) 100 MCG tablet Take 100 mcg by mouth daily before breakfast.     omeprazole (PRILOSEC) 40 MG capsule Take 1 capsule (40 mg total) by mouth daily. 60 capsule 5   OVER THE COUNTER MEDICATION Vit D 3 daily.     No current facility-administered medications for this visit.    Review of Systems: GENERAL: negative for malaise, night sweats HEENT: No changes in hearing or vision, no nose bleeds or other nasal problems. NECK: Negative for lumps, goiter, pain and significant neck swelling RESPIRATORY: Negative for cough, wheezing CARDIOVASCULAR: Negative for chest pain, leg swelling, palpitations, orthopnea GI: SEE HPI MUSCULOSKELETAL: Negative for joint pain or swelling, back pain, and muscle pain. SKIN: Negative for lesions, rash PSYCH: Negative for sleep disturbance, mood disorder and recent psychosocial stressors. HEMATOLOGY Negative for prolonged bleeding, bruising easily, and swollen nodes. ENDOCRINE: Negative for cold or heat intolerance, polyuria, polydipsia and goiter. NEURO: negative for tremor, gait imbalance, syncope and seizures. The remainder of the review of systems is noncontributory.   Physical Exam: BP (!) 158/90 (BP Location: Right Arm, Patient Position: Sitting, Cuff Size: Normal)   Pulse 69   Temp (!) 97.5 F (36.4 C) (Temporal)   Ht 5' 6.5" (1.689 m)   Wt 162 lb 14.4 oz (73.9 kg)   BMI 25.90 kg/m  GENERAL: The patient is AO x3, in no acute distress. HEENT: Head is normocephalic and atraumatic. EOMI are intact. Mouth is well hydrated and without lesions. NECK: Supple. No masses LUNGS: Clear to auscultation. No presence of rhonchi/wheezing/rales. Adequate chest expansion HEART: RRR, normal s1 and s2. ABDOMEN: Soft, nontender, no  guarding, no peritoneal signs, and nondistended. BS +. No masses. EXTREMITIES: Without any cyanosis, clubbing, rash, lesions or edema. NEUROLOGIC: AOx3, no focal motor deficit. SKIN: no jaundice, no rashes  Imaging/Labs: as above  I personally reviewed and interpreted the available labs, imaging and endoscopic files.  Impression and Plan: ARZELLA Flores is a 78 y.o. female with past medical history of reflux esophagitis, esophageal stricture, diverticulitis coming for follow-up of GERD.  The patient has had adequate control of her GERD while taking  omeprazole on a daily basis.  She should continue with the same dosage for now.  Has presented only dysphagia to large pills.  We discussed about the possibility of mixing these pills with puree consistency food for now.  -Continue omeprazole 40 mg qday -Try to mix larger vitamin pills with pure like food such as applesauce or yogurt  All questions were answered.      Katrinka Blazing, MD Gastroenterology and Hepatology [facility] Gastroenterology

## 2023-08-16 DIAGNOSIS — E7849 Other hyperlipidemia: Secondary | ICD-10-CM | POA: Diagnosis not present

## 2023-08-16 DIAGNOSIS — R5383 Other fatigue: Secondary | ICD-10-CM | POA: Diagnosis not present

## 2023-08-16 DIAGNOSIS — Z1321 Encounter for screening for nutritional disorder: Secondary | ICD-10-CM | POA: Diagnosis not present

## 2023-08-16 DIAGNOSIS — Z131 Encounter for screening for diabetes mellitus: Secondary | ICD-10-CM | POA: Diagnosis not present

## 2023-08-16 DIAGNOSIS — K760 Fatty (change of) liver, not elsewhere classified: Secondary | ICD-10-CM | POA: Diagnosis not present

## 2023-08-16 DIAGNOSIS — Z1329 Encounter for screening for other suspected endocrine disorder: Secondary | ICD-10-CM | POA: Diagnosis not present

## 2023-08-20 ENCOUNTER — Other Ambulatory Visit (INDEPENDENT_AMBULATORY_CARE_PROVIDER_SITE_OTHER): Payer: Self-pay | Admitting: Gastroenterology

## 2023-08-23 DIAGNOSIS — K2100 Gastro-esophageal reflux disease with esophagitis, without bleeding: Secondary | ICD-10-CM | POA: Diagnosis not present

## 2023-08-23 DIAGNOSIS — M8580 Other specified disorders of bone density and structure, unspecified site: Secondary | ICD-10-CM | POA: Diagnosis not present

## 2023-08-23 DIAGNOSIS — E7849 Other hyperlipidemia: Secondary | ICD-10-CM | POA: Diagnosis not present

## 2023-08-23 DIAGNOSIS — E782 Mixed hyperlipidemia: Secondary | ICD-10-CM | POA: Diagnosis not present

## 2023-08-23 DIAGNOSIS — K7581 Nonalcoholic steatohepatitis (NASH): Secondary | ICD-10-CM | POA: Diagnosis not present

## 2023-08-23 DIAGNOSIS — Z6825 Body mass index (BMI) 25.0-25.9, adult: Secondary | ICD-10-CM | POA: Diagnosis not present

## 2023-10-12 DIAGNOSIS — Z6825 Body mass index (BMI) 25.0-25.9, adult: Secondary | ICD-10-CM | POA: Diagnosis not present

## 2023-10-12 DIAGNOSIS — K5792 Diverticulitis of intestine, part unspecified, without perforation or abscess without bleeding: Secondary | ICD-10-CM | POA: Diagnosis not present

## 2023-10-17 DIAGNOSIS — L570 Actinic keratosis: Secondary | ICD-10-CM | POA: Diagnosis not present

## 2023-10-17 DIAGNOSIS — L661 Lichen planopilaris, unspecified: Secondary | ICD-10-CM | POA: Diagnosis not present

## 2023-10-17 DIAGNOSIS — L821 Other seborrheic keratosis: Secondary | ICD-10-CM | POA: Diagnosis not present

## 2023-10-26 DIAGNOSIS — K449 Diaphragmatic hernia without obstruction or gangrene: Secondary | ICD-10-CM | POA: Diagnosis not present

## 2023-10-26 DIAGNOSIS — K5790 Diverticulosis of intestine, part unspecified, without perforation or abscess without bleeding: Secondary | ICD-10-CM | POA: Diagnosis not present

## 2023-10-26 DIAGNOSIS — K5730 Diverticulosis of large intestine without perforation or abscess without bleeding: Secondary | ICD-10-CM | POA: Diagnosis not present

## 2023-10-26 DIAGNOSIS — D259 Leiomyoma of uterus, unspecified: Secondary | ICD-10-CM | POA: Diagnosis not present

## 2023-10-27 DIAGNOSIS — J069 Acute upper respiratory infection, unspecified: Secondary | ICD-10-CM | POA: Diagnosis not present

## 2023-10-27 DIAGNOSIS — H6123 Impacted cerumen, bilateral: Secondary | ICD-10-CM | POA: Diagnosis not present

## 2023-10-27 DIAGNOSIS — Z6824 Body mass index (BMI) 24.0-24.9, adult: Secondary | ICD-10-CM | POA: Diagnosis not present

## 2023-10-27 DIAGNOSIS — H6121 Impacted cerumen, right ear: Secondary | ICD-10-CM | POA: Diagnosis not present

## 2023-10-27 DIAGNOSIS — H6122 Impacted cerumen, left ear: Secondary | ICD-10-CM | POA: Diagnosis not present

## 2023-10-27 DIAGNOSIS — H1031 Unspecified acute conjunctivitis, right eye: Secondary | ICD-10-CM | POA: Diagnosis not present

## 2023-12-13 DIAGNOSIS — H43393 Other vitreous opacities, bilateral: Secondary | ICD-10-CM | POA: Diagnosis not present

## 2024-01-17 ENCOUNTER — Encounter (INDEPENDENT_AMBULATORY_CARE_PROVIDER_SITE_OTHER): Payer: Self-pay | Admitting: *Deleted

## 2024-02-27 DIAGNOSIS — R5383 Other fatigue: Secondary | ICD-10-CM | POA: Diagnosis not present

## 2024-02-27 DIAGNOSIS — E559 Vitamin D deficiency, unspecified: Secondary | ICD-10-CM | POA: Diagnosis not present

## 2024-02-27 DIAGNOSIS — E039 Hypothyroidism, unspecified: Secondary | ICD-10-CM | POA: Diagnosis not present

## 2024-02-27 DIAGNOSIS — K2100 Gastro-esophageal reflux disease with esophagitis, without bleeding: Secondary | ICD-10-CM | POA: Diagnosis not present

## 2024-02-27 DIAGNOSIS — Z131 Encounter for screening for diabetes mellitus: Secondary | ICD-10-CM | POA: Diagnosis not present

## 2024-02-27 DIAGNOSIS — E7849 Other hyperlipidemia: Secondary | ICD-10-CM | POA: Diagnosis not present

## 2024-03-05 DIAGNOSIS — E039 Hypothyroidism, unspecified: Secondary | ICD-10-CM | POA: Diagnosis not present

## 2024-03-05 DIAGNOSIS — M8580 Other specified disorders of bone density and structure, unspecified site: Secondary | ICD-10-CM | POA: Diagnosis not present

## 2024-03-05 DIAGNOSIS — Z23 Encounter for immunization: Secondary | ICD-10-CM | POA: Diagnosis not present

## 2024-03-05 DIAGNOSIS — Z0001 Encounter for general adult medical examination with abnormal findings: Secondary | ICD-10-CM | POA: Diagnosis not present

## 2024-03-05 DIAGNOSIS — K7581 Nonalcoholic steatohepatitis (NASH): Secondary | ICD-10-CM | POA: Diagnosis not present

## 2024-03-05 DIAGNOSIS — Z1331 Encounter for screening for depression: Secondary | ICD-10-CM | POA: Diagnosis not present

## 2024-03-05 DIAGNOSIS — E559 Vitamin D deficiency, unspecified: Secondary | ICD-10-CM | POA: Diagnosis not present

## 2024-03-05 DIAGNOSIS — Z1389 Encounter for screening for other disorder: Secondary | ICD-10-CM | POA: Diagnosis not present

## 2024-03-05 DIAGNOSIS — K2100 Gastro-esophageal reflux disease with esophagitis, without bleeding: Secondary | ICD-10-CM | POA: Diagnosis not present

## 2024-03-12 ENCOUNTER — Encounter (INDEPENDENT_AMBULATORY_CARE_PROVIDER_SITE_OTHER): Payer: Self-pay | Admitting: Gastroenterology

## 2024-03-21 DIAGNOSIS — G459 Transient cerebral ischemic attack, unspecified: Secondary | ICD-10-CM | POA: Diagnosis not present

## 2024-03-21 DIAGNOSIS — H539 Unspecified visual disturbance: Secondary | ICD-10-CM | POA: Diagnosis not present
